# Patient Record
Sex: Female | Born: 2000 | Race: White | Hispanic: No | Marital: Single | Smoking: Never smoker
Health system: Southern US, Community
[De-identification: ages and names within clinical notes are randomized; demographics above are authoritative.]

## PROBLEM LIST (undated history)

## (undated) DIAGNOSIS — N946 Dysmenorrhea, unspecified: Secondary | ICD-10-CM

## (undated) DIAGNOSIS — K589 Irritable bowel syndrome without diarrhea: Secondary | ICD-10-CM

## (undated) DIAGNOSIS — K5010 Crohn's disease of large intestine without complications: Secondary | ICD-10-CM

## (undated) DIAGNOSIS — E079 Disorder of thyroid, unspecified: Secondary | ICD-10-CM

## (undated) DIAGNOSIS — D509 Iron deficiency anemia, unspecified: Secondary | ICD-10-CM

## (undated) DIAGNOSIS — R197 Diarrhea, unspecified: Secondary | ICD-10-CM

## (undated) HISTORY — DX: Diarrhea, unspecified: R19.7

## (undated) HISTORY — PX: CHOLECYSTECTOMY: SHX55

## (undated) HISTORY — PX: WISDOM TOOTH EXTRACTION: SHX21

## (undated) HISTORY — DX: Irritable bowel syndrome, unspecified: K58.9

## (undated) HISTORY — PX: TONSILLECTOMY: SUR1361

---
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
## 2020-08-19 ENCOUNTER — Telehealth: Payer: Self-pay | Admitting: Gastroenterology

## 2020-08-19 NOTE — Telephone Encounter (Signed)
Hey Dr Lavon Paganini, this patient is being referred to [place] by Dr Mila Palmer for Crohn's Disease but they have seen [facility] Feb 2021.  I will send records to you for review. Please advise on scheduling, thank you.

## 2020-08-23 NOTE — Telephone Encounter (Signed)
Dr Lavon Paganini declined referral transfer at this time. Call patient to advise

## 2020-08-24 NOTE — Telephone Encounter (Signed)
Spoke with pt and informed her of Dr Elana Alm message, pt voiced understanding

## 2020-08-30 ENCOUNTER — Other Ambulatory Visit: Payer: Self-pay

## 2020-08-30 ENCOUNTER — Emergency Department (HOSPITAL_COMMUNITY): Payer: Medicaid Other

## 2020-08-30 ENCOUNTER — Encounter (HOSPITAL_COMMUNITY): Payer: Self-pay

## 2020-08-30 ENCOUNTER — Emergency Department (HOSPITAL_COMMUNITY)
Admission: EM | Admit: 2020-08-30 | Discharge: 2020-08-30 | Disposition: A | Discharge status: Home / Self Care | Payer: Medicaid Other | Attending: Emergency Medicine | Admitting: Emergency Medicine

## 2020-08-30 DIAGNOSIS — R1012 Left upper quadrant pain: Secondary | ICD-10-CM | POA: Diagnosis not present

## 2020-08-30 DIAGNOSIS — R1011 Right upper quadrant pain: Secondary | ICD-10-CM | POA: Insufficient documentation

## 2020-08-30 DIAGNOSIS — R000 Tachycardia, unspecified: Secondary | ICD-10-CM | POA: Diagnosis not present

## 2020-08-30 DIAGNOSIS — R1013 Epigastric pain: Secondary | ICD-10-CM

## 2020-08-30 DIAGNOSIS — R1084 Generalized abdominal pain: Secondary | ICD-10-CM | POA: Insufficient documentation

## 2020-08-30 DIAGNOSIS — R109 Unspecified abdominal pain: Secondary | ICD-10-CM | POA: Diagnosis present

## 2020-08-30 LAB — URINALYSIS, ROUTINE W REFLEX MICROSCOPIC
Bilirubin Urine: NEGATIVE
Glucose, UA: NEGATIVE mg/dL
Hgb urine dipstick: NEGATIVE
Ketones, ur: NEGATIVE mg/dL
Nitrite: NEGATIVE
Protein, ur: 30 mg/dL — AB
Specific Gravity, Urine: 1.021 (ref 1.005–1.030)
pH: 5 (ref 5.0–8.0)

## 2020-08-30 LAB — CBC
HCT: 38.6 % (ref 36.0–46.0)
Hemoglobin: 11.3 g/dL — ABNORMAL LOW (ref 12.0–15.0)
MCH: 21 pg — ABNORMAL LOW (ref 26.0–34.0)
MCHC: 29.3 g/dL — ABNORMAL LOW (ref 30.0–36.0)
MCV: 71.9 fL — ABNORMAL LOW (ref 80.0–100.0)
Platelets: 356 10*3/uL (ref 150–400)
RBC: 5.37 MIL/uL — ABNORMAL HIGH (ref 3.87–5.11)
RDW: 17.6 % — ABNORMAL HIGH (ref 11.5–15.5)
WBC: 5.8 10*3/uL (ref 4.0–10.5)
nRBC: 0 % (ref 0.0–0.2)

## 2020-08-30 LAB — COMPREHENSIVE METABOLIC PANEL
ALT: 19 U/L (ref 0–44)
AST: 27 U/L (ref 15–41)
Albumin: 4.2 g/dL (ref 3.5–5.0)
Alkaline Phosphatase: 75 U/L (ref 38–126)
Anion gap: 11 (ref 5–15)
BUN: 6 mg/dL (ref 6–20)
CO2: 24 mmol/L (ref 22–32)
Calcium: 9.3 mg/dL (ref 8.9–10.3)
Chloride: 102 mmol/L (ref 98–111)
Creatinine, Ser: 1.04 mg/dL — ABNORMAL HIGH (ref 0.44–1.00)
GFR, Estimated: >60 mL/min (ref >60)
Glucose, Bld: 105 mg/dL — ABNORMAL HIGH (ref 70–99)
Potassium: 3.2 mmol/L — ABNORMAL LOW (ref 3.5–5.1)
Sodium: 137 mmol/L (ref 135–145)
Total Bilirubin: 0.3 mg/dL (ref 0.3–1.2)
Total Protein: 8.3 g/dL — ABNORMAL HIGH (ref 6.5–8.1)

## 2020-08-30 LAB — I-STAT BETA HCG BLOOD, ED (MC, WL, AP ONLY): I-stat hCG, quantitative: <5 m[IU]/mL (ref <5)

## 2020-08-30 LAB — LIPASE, BLOOD: Lipase: 54 U/L — ABNORMAL HIGH (ref 11–51)

## 2020-08-30 MED ORDER — ONDANSETRON HCL 4 MG/2ML IJ SOLN
4.0000 mg | Freq: Once | INTRAMUSCULAR | Status: AC
  Administered 2020-08-30: 4 mg via INTRAVENOUS
  Filled 2020-08-30: qty 2

## 2020-08-30 MED ORDER — SODIUM CHLORIDE 0.9 % IV BOLUS
1000.0000 mL | Freq: Once | INTRAVENOUS | Status: AC
  Administered 2020-08-30: 1000 mL via INTRAVENOUS

## 2020-08-30 MED ORDER — IOHEXOL 300 MG/ML  SOLN
100.0000 mL | Freq: Once | INTRAMUSCULAR | Status: AC | PRN
  Administered 2020-08-30: 100 mL via INTRAVENOUS

## 2020-08-30 MED ORDER — MORPHINE SULFATE (PF) 2 MG/ML IV SOLN
2.0000 mg | Freq: Once | INTRAVENOUS | Status: AC
  Administered 2020-08-30: 2 mg via INTRAVENOUS
  Filled 2020-08-30: qty 1

## 2020-08-30 MED ORDER — SUCRALFATE 1 G PO TABS: 1.0000 g | ORAL_TABLET | Freq: Three times a day (TID) | ORAL | 1 refills | Status: DC

## 2020-08-30 MED ORDER — PROMETHAZINE HCL 25 MG RE SUPP: 25.0000 mg | Freq: Four times a day (QID) | RECTAL | 1 refills | Status: DC | PRN

## 2020-08-30 NOTE — ED Triage Notes (Signed)
Pt presents with c/o abdominal pain. Pt reports she does have Crohn's disease but reports that for the past month, this pain has been worse than her normal pain. Pt also reports some low grade fevers with the pain and vomiting.

## 2020-08-30 NOTE — Discharge Instructions (Signed)
Please read instructions below. Drink clear liquids until your stomach feels better. Then, slowly introduce bland foods into your diet as tolerated.  Avoid spicy, greasy, acidic foods as this can worsen your symptoms.  Avoid NSAID medications such as Advil/ibuprofen/Motrin, Aleve, aspirin, Goody's powder, BC powder.  Remain is sitting upright for at least 45 minutes after meals. Take the carafate before meals and at bedtime to help with stomach upset. Continue taking your pantoprazole as directed. Continue taking the Pepcid twice daily. Follow up with your gastroenterologist. Return to the ER for severely worsening abdominal pain, fever, uncontrollable vomiting, or vomiting blood.

## 2020-08-30 NOTE — ED Provider Notes (Signed)
[facility]-EMERGENCY DEPT Provider Note   CSN: [id number] Arrival date & time: 08/30/20  1601     History Chief Complaint  Patient presents with  . Abdominal Pain    Alexis Petty is a 19 y.o. female w PMHx crohn dz, iron deficiency anemia, PUD, presenting to the ED with complaint of worsening abdominal pain over the last month. Pain is constant, waxing and waning, dull, worsening last week. Pain is in the epigastrium.  She states pain is preventing her from tolerating much p.o. because whenever she eats she has severe pain.  She is also having some nausea, with intermittent episodes of emesis which she attributes to pain.  Symptoms feel different from prior Crohn's flare or PUD.  Denies assoc fever/chills, urinary sx, new changes in bowel movements.   Last colonoscopy was July 2020.  Followed by [facility] pediatric gastroenterology.  The history is provided by the patient.       Past Medical History:  Diagnosis Date  . Abdominal pain   . Crohn's colitis (HCC)   . Diarrhea   . Dysmenorrhea    Seen at [facility]  . IBS (irritable bowel syndrome)   . IBS (irritable bowel syndrome)   . Iron deficiency anemia     Patient Active Problem List   Diagnosis Date Noted  . Abnormal uterine bleeding (AUB) 12/29/2014  . Anemia due to blood loss, acute 10/03/2013  . Abnormal uterine bleeding 07/07/2013  . Adiposity 10/17/2012  . Abdominal pain   . Diarrhea   . IBS (irritable bowel syndrome)     Past Surgical History:  Procedure Laterality Date  . CHOLECYSTECTOMY    . TONSILLECTOMY Bilateral   . WISDOM TOOTH EXTRACTION       OB History    Gravida  0   Para      Term      Preterm      AB      Living        SAB      TAB      Ectopic      Multiple      Live Births              Family History  Problem Relation Age of Onset  . Heart disease Mother   . Diabetes Father   . Hypertension Father   . Diabetes Maternal Grandmother    . Cancer Paternal Grandmother   . Cancer Paternal Grandfather     Social History   Tobacco Use  . Smoking status: Never Smoker  . Smokeless tobacco: Never Used  Vaping Use  . Vaping Use: Never used  Substance Use Topics  . Alcohol use: No  . Drug use: No    Home Medications Prior to Admission medications   Medication Sig Start Date End Date Taking? Authorizing Provider  ALAYCEN 1/35 tablet Take 1 tablet by mouth daily. 08/19/20  Yes [provider]  balsalazide (COLAZAL) 750 MG capsule Take 2,250 mg by mouth 2 (two) times daily.  02/26/17  Yes [provider]  Cholecalciferol 25 MCG (1000 UT) capsule Take 1,000 Units by mouth 2 (two) times daily. 07/07/19  Yes [provider]  dicyclomine (BENTYL) 20 MG tablet Take 20 mg by mouth as needed for spasms (pain).  11/30/19  Yes [provider]  famotidine (PEPCID) 20 MG tablet Take 20 mg by mouth as needed for heartburn or indigestion (take when protonix isn't helping).   Yes [provider]  ondansetron (ZOFRAN-ODT) 8 MG disintegrating tablet Take 8 mg by mouth every 8 (eight) hours as needed for nausea or vomiting.  10/07/15  Yes [provider]  pantoprazole (PROTONIX) 40 MG tablet Take 40 mg by mouth 2 (two) times daily. 08/24/20  Yes [provider]  albuterol (PROVENTIL HFA;VENTOLIN HFA) 108 (90 Base) MCG/ACT inhaler Inhale 2 puffs into the lungs every 4 (four) hours as needed for wheezing or shortness of breath. Patient not taking: Reported on 04/27/2017 01/10/16   Mesner, Barbara Cower, MD  promethazine (PHENERGAN) 25 MG suppository Place 1 suppository (25 mg total) rectally every 6 (six) hours as needed for nausea or vomiting. 08/30/20   Netty Sullivant, [place] N, PA-C  sucralfate (CARAFATE) 1 g tablet Take 1 tablet (1 g total) by mouth 4 (four) times daily -  with meals and at bedtime. 08/30/20   Denene Alamillo, [place] N, PA-C    Allergies    Ibuprofen, Reglan [metoclopramide], Tylenol  [acetaminophen], and Haldol [haloperidol]  Review of Systems   Review of Systems  Gastrointestinal: Positive for abdominal pain and nausea.  All other systems reviewed and are negative.   Physical Exam Updated Vital Signs BP (!) 145/72   Pulse 99   Temp 98.5 F (36.9 C) (Oral)   Resp 17   LMP 08/16/2020 (Approximate)   SpO2 100%   Physical Exam Vitals and nursing note reviewed.  Constitutional:      General: She is not in acute distress.    Appearance: She is well-developed. She is not ill-appearing.  HENT:     Head: Normocephalic and atraumatic.     Mouth/Throat:     Mouth: Mucous membranes are moist.  Eyes:     Conjunctiva/sclera: Conjunctivae normal.  Cardiovascular:     Rate and Rhythm: Regular rhythm. Tachycardia present.  Pulmonary:     Effort: Pulmonary effort is normal. No respiratory distress.     Breath sounds: Normal breath sounds.  Abdominal:     Palpations: Abdomen is soft.     Tenderness: There is abdominal tenderness in the right upper quadrant, epigastric area and left upper quadrant. There is no guarding or rebound.  Skin:    General: Skin is warm.  Neurological:     Mental Status: She is alert.  Psychiatric:        Behavior: Behavior normal.     ED Results / Procedures / Treatments   Labs (all labs ordered are listed, but only abnormal results are displayed) Labs Reviewed  LIPASE, BLOOD - Abnormal; Notable for the following components:      Result Value   Lipase 54 (*)    All other components within normal limits  COMPREHENSIVE METABOLIC PANEL - Abnormal; Notable for the following components:   Potassium 3.2 (*)    Glucose, Bld 105 (*)    Creatinine, Ser 1.04 (*)    Total Protein 8.3 (*)    All other components within normal limits  CBC - Abnormal; Notable for the following components:   RBC 5.37 (*)    Hemoglobin 11.3 (*)    MCV 71.9 (*)    MCH 21.0 (*)    MCHC 29.3 (*)    RDW 17.6 (*)    All other components within normal limits   URINALYSIS, ROUTINE W REFLEX MICROSCOPIC - Abnormal; Notable for the following components:   Color, Urine AMBER (*)    APPearance HAZY (*)    Protein, ur 30 (*)    Leukocytes,Ua SMALL (*)    Bacteria, UA MANY (*)  All other components within normal limits  I-STAT BETA HCG BLOOD, ED ([facility], [facility], [facility] ONLY)    EKG None  Radiology CT Abdomen Pelvis W Contrast  Result Date: 08/30/2020 CLINICAL DATA:  Abdominal pain. EXAM: CT ABDOMEN AND PELVIS WITH CONTRAST TECHNIQUE: Multidetector CT imaging of the abdomen and pelvis was performed using the standard protocol following bolus administration of intravenous contrast. CONTRAST:  OMNIPAQUE IOHEXOL 300 MG/ML  SOLN COMPARISON:  August 31, 2019 FINDINGS: Lower chest: No acute abnormality. Hepatobiliary: No focal liver abnormality is seen. Status post cholecystectomy. No biliary dilatation. Pancreas: Unremarkable. No pancreatic ductal dilatation or surrounding inflammatory changes. Spleen: Normal in size without focal abnormality. Adrenals/Urinary Tract: Adrenal glands are unremarkable. Kidneys are normal, without renal calculi, focal lesion, or hydronephrosis. Bladder is unremarkable. Stomach/Bowel: Stomach is within normal limits. Appendix appears normal. No evidence of bowel wall thickening, distention, or inflammatory changes. Vascular/Lymphatic: No significant vascular findings are present. No enlarged abdominal or pelvic lymph nodes are identified. Multiple stable subcentimeter mesenteric lymph nodes are seen scattered throughout the abdomen. Reproductive: Uterus and bilateral adnexa are unremarkable. Other: No abdominal wall hernia or abnormality. No abdominopelvic ascites. Musculoskeletal: No acute or significant osseous findings. IMPRESSION: 1. Evidence of prior cholecystectomy. 2. No evidence of an acute or active process within the abdomen or pelvis. Electronically Signed   By: Aram Candela M.D.   On: 08/30/2020 19:24     Procedures Procedures (including critical care time)  Medications Ordered in ED Medications  ondansetron (ZOFRAN) injection 4 mg (4 mg Intravenous Given 08/30/20 1701)  sodium chloride 0.9 % bolus 1,000 mL (0 mLs Intravenous Stopped 08/30/20 1948)  morphine 2 MG/ML injection 2 mg (2 mg Intravenous Given 08/30/20 1702)  iohexol (OMNIPAQUE) 300 MG/ML solution 100 mL (100 mLs Intravenous Contrast Given 08/30/20 1840)  sodium chloride 0.9 % bolus 1,000 mL (0 mLs Intravenous Stopped 08/30/20 2207)  ondansetron (ZOFRAN) injection 4 mg (4 mg Intravenous Given 08/30/20 2016)    ED Course  I have reviewed the triage vital signs and the nursing notes.  Pertinent labs & imaging results that were available during my care of the patient were reviewed by me and considered in my medical decision making (see chart for details).    MDM Rules/Calculators/A&P                          Patient with history of Crohn's disease, PUD,  presenting with gradually worsening epigastric pain over the last month.  She states pain is preventing her from tolerating much p.o. because whenever she eats she has severe pain.  She is also having some nausea, with intermittent episodes of emesis which she attributes to pain.  On exam, she is noted to be tachycardic though in no acute distress.  Abdomen is soft with tenderness in the upper to mid.  No peritoneal signs.  Laboratory work-up ordered, IV fluids, pain medication and antiemetics.  Considering patient's abdominal history, CT scan is also obtained for further evaluation.  Abs are very reassuring, no leukocytosis.  Metabolic panel without significant change from baseline.  UA appears to have some contaminant however no bacteria.  Patient is asymptomatic.  Antibiotics not indicated.  CT scan is negative.  On reevaluation, patient reports improvement in symptoms.  He is tolerating p.o. fluids and feels comfortable with discharge.  Suspect patient's symptoms are likely  due to gastritis versus peptic ulcer.  She is instructed to continue PPI and H2 blockers.  We  will also prescribe Carafate, rectal Phenergan for nausea relief.  She is followed by [facility] pediatric gastroenterology, recommend she follow closely.  Return for worsening symptoms.  Patient and her mother verbalized understanding agree with care plan.  Discussed results, findings, treatment and follow up. Patient advised of return precautions. Patient verbalized understanding and agreed with plan.  Final Clinical Impression(s) / ED Diagnoses Final diagnoses:  Epigastric abdominal pain    Rx / DC Orders ED Discharge Orders         Ordered    promethazine (PHENERGAN) 25 MG suppository  Every 6 hours PRN        08/30/20 2135    sucralfate (CARAFATE) 1 g tablet  3 times daily with meals & bedtime        08/30/20 2135           Joey Hudock, [place] N, PA-C 08/30/20 2229    Sabas Sous, MD 08/30/20 2257

## 2020-08-30 NOTE — ED Notes (Signed)
Pt given water and ginger ale for fluid challenge. Pt tolerated fluids well

## 2020-10-13 ENCOUNTER — Other Ambulatory Visit: Payer: Self-pay

## 2020-10-13 ENCOUNTER — Emergency Department (HOSPITAL_COMMUNITY)
Admission: EM | Admit: 2020-10-13 | Discharge: 2020-10-13 | Disposition: A | Discharge status: Home / Self Care | Payer: Medicaid Other | Attending: Emergency Medicine | Admitting: Emergency Medicine

## 2020-10-13 DIAGNOSIS — R112 Nausea with vomiting, unspecified: Secondary | ICD-10-CM | POA: Insufficient documentation

## 2020-10-13 DIAGNOSIS — Z5321 Procedure and treatment not carried out due to patient leaving prior to being seen by health care provider: Secondary | ICD-10-CM | POA: Insufficient documentation

## 2020-10-13 DIAGNOSIS — R1030 Lower abdominal pain, unspecified: Secondary | ICD-10-CM | POA: Diagnosis present

## 2020-10-13 DIAGNOSIS — K5900 Constipation, unspecified: Secondary | ICD-10-CM | POA: Diagnosis not present

## 2020-10-13 LAB — CBC
HCT: 36.4 % (ref 36.0–46.0)
Hemoglobin: 10.5 g/dL — ABNORMAL LOW (ref 12.0–15.0)
MCH: 21.5 pg — ABNORMAL LOW (ref 26.0–34.0)
MCHC: 28.8 g/dL — ABNORMAL LOW (ref 30.0–36.0)
MCV: 74.6 fL — ABNORMAL LOW (ref 80.0–100.0)
Platelets: 302 10*3/uL (ref 150–400)
RBC: 4.88 MIL/uL (ref 3.87–5.11)
RDW: 20.1 % — ABNORMAL HIGH (ref 11.5–15.5)
WBC: 6.9 10*3/uL (ref 4.0–10.5)
nRBC: 0 % (ref 0.0–0.2)

## 2020-10-13 LAB — LIPASE, BLOOD: Lipase: 87 U/L — ABNORMAL HIGH (ref 11–51)

## 2020-10-13 LAB — I-STAT BETA HCG BLOOD, ED (MC, WL, AP ONLY): I-stat hCG, quantitative: <5 m[IU]/mL (ref <5)

## 2020-10-13 LAB — COMPREHENSIVE METABOLIC PANEL
ALT: 18 U/L (ref 0–44)
AST: 22 U/L (ref 15–41)
Albumin: 3.8 g/dL (ref 3.5–5.0)
Alkaline Phosphatase: 62 U/L (ref 38–126)
Anion gap: 17 — ABNORMAL HIGH (ref 5–15)
BUN: 11 mg/dL (ref 6–20)
CO2: 19 mmol/L — ABNORMAL LOW (ref 22–32)
Calcium: 9 mg/dL (ref 8.9–10.3)
Chloride: 102 mmol/L (ref 98–111)
Creatinine, Ser: 0.88 mg/dL (ref 0.44–1.00)
GFR, Estimated: >60 mL/min (ref >60)
Glucose, Bld: 93 mg/dL (ref 70–99)
Potassium: 3.8 mmol/L (ref 3.5–5.1)
Sodium: 138 mmol/L (ref 135–145)
Total Bilirubin: 0.9 mg/dL (ref 0.3–1.2)
Total Protein: 7.5 g/dL (ref 6.5–8.1)

## 2020-10-13 NOTE — ED Triage Notes (Signed)
Pt BIBA from home-  Per EMS- Pt reports lower abdominal pain and constipation x16 days. C/o n/v for same.  Pt reports trying oral zofran but unable to keep it down. Pt reports poor PO intake, recent weight loss.    Hx of Crohns.   20g L AC- PIV 850 NaCL 4 mg zofran,  250 mcg fentanyl  Sinus tach at 150 prior to fluid administration, 115 after fluids. Pt reports difficulty with ambulation.   Pt reports ongoing emesis, bile and stomach contents.   AOx4.

## 2020-10-13 NOTE — ED Notes (Signed)
Pt requested IV removal so she could leave prior to being seen by provider. Pt encouraged to stay.

## 2020-10-30 ENCOUNTER — Emergency Department (HOSPITAL_COMMUNITY)
Admission: EM | Admit: 2020-10-30 | Discharge: 2020-10-30 | Disposition: A | Discharge status: Home / Self Care | Payer: Medicaid Other | Attending: Emergency Medicine | Admitting: Emergency Medicine

## 2020-10-30 ENCOUNTER — Emergency Department (HOSPITAL_COMMUNITY): Payer: Medicaid Other

## 2020-10-30 ENCOUNTER — Other Ambulatory Visit: Payer: Self-pay

## 2020-10-30 ENCOUNTER — Encounter (HOSPITAL_COMMUNITY): Payer: Self-pay | Admitting: Obstetrics and Gynecology

## 2020-10-30 DIAGNOSIS — Z9049 Acquired absence of other specified parts of digestive tract: Secondary | ICD-10-CM | POA: Insufficient documentation

## 2020-10-30 DIAGNOSIS — Z20822 Contact with and (suspected) exposure to covid-19: Secondary | ICD-10-CM | POA: Diagnosis not present

## 2020-10-30 DIAGNOSIS — R1011 Right upper quadrant pain: Secondary | ICD-10-CM | POA: Insufficient documentation

## 2020-10-30 DIAGNOSIS — E876 Hypokalemia: Secondary | ICD-10-CM

## 2020-10-30 DIAGNOSIS — Z8719 Personal history of other diseases of the digestive system: Secondary | ICD-10-CM | POA: Insufficient documentation

## 2020-10-30 DIAGNOSIS — R1013 Epigastric pain: Secondary | ICD-10-CM | POA: Insufficient documentation

## 2020-10-30 DIAGNOSIS — R109 Unspecified abdominal pain: Secondary | ICD-10-CM

## 2020-10-30 LAB — COMPREHENSIVE METABOLIC PANEL
ALT: 150 U/L — ABNORMAL HIGH (ref 0–44)
AST: 64 U/L — ABNORMAL HIGH (ref 15–41)
Albumin: 4.3 g/dL (ref 3.5–5.0)
Alkaline Phosphatase: 95 U/L (ref 38–126)
Anion gap: 16 — ABNORMAL HIGH (ref 5–15)
BUN: 10 mg/dL (ref 6–20)
CO2: 23 mmol/L (ref 22–32)
Calcium: 9.8 mg/dL (ref 8.9–10.3)
Chloride: 101 mmol/L (ref 98–111)
Creatinine, Ser: 0.91 mg/dL (ref 0.44–1.00)
GFR, Estimated: >60 mL/min (ref >60)
Glucose, Bld: 106 mg/dL — ABNORMAL HIGH (ref 70–99)
Potassium: 3.2 mmol/L — ABNORMAL LOW (ref 3.5–5.1)
Sodium: 140 mmol/L (ref 135–145)
Total Bilirubin: 1.3 mg/dL — ABNORMAL HIGH (ref 0.3–1.2)
Total Protein: 8.5 g/dL — ABNORMAL HIGH (ref 6.5–8.1)

## 2020-10-30 LAB — LIPASE, BLOOD: Lipase: 101 U/L — ABNORMAL HIGH (ref 11–51)

## 2020-10-30 LAB — CBC
HCT: 39.9 % (ref 36.0–46.0)
Hemoglobin: 12.2 g/dL (ref 12.0–15.0)
MCH: 22.1 pg — ABNORMAL LOW (ref 26.0–34.0)
MCHC: 30.6 g/dL (ref 30.0–36.0)
MCV: 72.4 fL — ABNORMAL LOW (ref 80.0–100.0)
Platelets: 402 10*3/uL — ABNORMAL HIGH (ref 150–400)
RBC: 5.51 MIL/uL — ABNORMAL HIGH (ref 3.87–5.11)
RDW: 20.7 % — ABNORMAL HIGH (ref 11.5–15.5)
WBC: 7.9 10*3/uL (ref 4.0–10.5)
nRBC: 0 % (ref 0.0–0.2)

## 2020-10-30 LAB — URINALYSIS, ROUTINE W REFLEX MICROSCOPIC
Bilirubin Urine: NEGATIVE
Glucose, UA: NEGATIVE mg/dL
Ketones, ur: 80 mg/dL — AB
Leukocytes,Ua: NEGATIVE
Nitrite: NEGATIVE
Protein, ur: 30 mg/dL — AB
RBC / HPF: >50 RBC/hpf — ABNORMAL HIGH (ref 0–5)
Specific Gravity, Urine: <1.005 — ABNORMAL LOW (ref 1.005–1.030)
pH: 6 (ref 5.0–8.0)

## 2020-10-30 LAB — I-STAT BETA HCG BLOOD, ED (MC, WL, AP ONLY): I-stat hCG, quantitative: <5 m[IU]/mL (ref <5)

## 2020-10-30 MED ORDER — SODIUM CHLORIDE 0.9 % IV SOLN: INTRAVENOUS | Status: DC

## 2020-10-30 MED ORDER — ONDANSETRON HCL 4 MG/2ML IJ SOLN
4.0000 mg | Freq: Once | INTRAMUSCULAR | Status: AC
  Administered 2020-10-30: 4 mg via INTRAVENOUS
  Filled 2020-10-30: qty 2

## 2020-10-30 MED ORDER — IOHEXOL 300 MG/ML  SOLN
100.0000 mL | Freq: Once | INTRAMUSCULAR | Status: AC | PRN
  Administered 2020-10-30: 100 mL via INTRAVENOUS

## 2020-10-30 MED ORDER — MORPHINE SULFATE (PF) 4 MG/ML IV SOLN
6.0000 mg | Freq: Once | INTRAVENOUS | Status: AC
  Administered 2020-10-30: 6 mg via INTRAVENOUS
  Filled 2020-10-30: qty 2

## 2020-10-30 MED ORDER — PROMETHAZINE HCL 25 MG RE SUPP: 25.0000 mg | Freq: Four times a day (QID) | RECTAL | 0 refills | Status: DC | PRN

## 2020-10-30 MED ORDER — SODIUM CHLORIDE 0.9 % IV BOLUS
1000.0000 mL | Freq: Once | INTRAVENOUS | Status: AC
  Administered 2020-10-30: 1000 mL via INTRAVENOUS

## 2020-10-30 MED ORDER — POTASSIUM CHLORIDE CRYS ER 20 MEQ PO TBCR
40.0000 meq | EXTENDED_RELEASE_TABLET | Freq: Once | ORAL | Status: AC
  Administered 2020-10-30: 40 meq via ORAL
  Filled 2020-10-30: qty 2

## 2020-10-30 MED ORDER — HYDROMORPHONE HCL 1 MG/ML IJ SOLN: 1.0000 mg | Freq: Once | INTRAMUSCULAR | Status: DC

## 2020-10-30 NOTE — ED Notes (Signed)
Pt wanting to wait till her Mom was able to come back to her room before getting urine sample.

## 2020-10-30 NOTE — ED Triage Notes (Signed)
Patient reports to the ER BIB EMS for abdominal pain. Paitnet reports she has Chrons. Patient reports N/V and that she has not been able to "eat or drink for 4 weeks" Patient's been given 4mg  of zofran with no relief.  Patient reports she is tender to her abdomen.  Patinet has a 22g IV in the L wrist.

## 2020-10-30 NOTE — ED Provider Notes (Signed)
[facility]-EMERGENCY DEPT Provider Note   CSN: [id number] Arrival date & time: 10/30/20  1326     History Chief Complaint  Patient presents with  . Abdominal Pain    Ronasia Isola is a 20 y.o. female.  20 year old female with history of Crohn's disease who presents with epigastric abdominal pain similar to her Crohn's disease.  States that she has had a longstanding history of emesis and scheduled to have an EGD and colonoscopy done next week.  No fever or chills.  Emesis has been nonbilious or bloody.  Denies any urinary symptoms.  Pain is crampy and unresponsive to home medications.        Past Medical History:  Diagnosis Date  . Abdominal pain   . Crohn's colitis (HCC)   . Diarrhea   . Dysmenorrhea    Seen at [facility]  . IBS (irritable bowel syndrome)   . IBS (irritable bowel syndrome)   . Iron deficiency anemia     Patient Active Problem List   Diagnosis Date Noted  . Abnormal uterine bleeding (AUB) 12/29/2014  . Anemia due to blood loss, acute 10/03/2013  . Abnormal uterine bleeding 07/07/2013  . Adiposity 10/17/2012  . Abdominal pain   . Diarrhea   . IBS (irritable bowel syndrome)     Past Surgical History:  Procedure Laterality Date  . CHOLECYSTECTOMY    . TONSILLECTOMY Bilateral   . WISDOM TOOTH EXTRACTION       OB History    Gravida  0   Para      Term      Preterm      AB      Living        SAB      IAB      Ectopic      Multiple      Live Births              Family History  Problem Relation Age of Onset  . Heart disease Mother   . Diabetes Father   . Hypertension Father   . Diabetes Maternal Grandmother   . Cancer Paternal Grandmother   . Cancer Paternal Grandfather     Social History   Tobacco Use  . Smoking status: Never Smoker  . Smokeless tobacco: Never Used  Vaping Use  . Vaping Use: Never used  Substance Use Topics  . Alcohol use: No  . Drug use: No    Home  Medications Prior to Admission medications   Medication Sig Start Date End Date Taking? Authorizing Provider  ALAYCEN 1/35 tablet Take 1 tablet by mouth daily. 08/19/20  Yes [provider]  balsalazide (COLAZAL) 750 MG capsule Take 2,250 mg by mouth 2 (two) times daily.  02/26/17  Yes [provider]  Cholecalciferol 25 MCG (1000 UT) capsule Take 1,000 Units by mouth 2 (two) times daily. 07/07/19  Yes [provider]  dicyclomine (BENTYL) 20 MG tablet Take 20 mg by mouth daily as needed for spasms (pain). 11/30/19  Yes [provider]  famotidine (PEPCID) 20 MG tablet Take 20 mg by mouth daily as needed for heartburn or indigestion (take when protonix isn't helping).   Yes [provider]  ferrous sulfate 325 (65 FE) MG EC tablet Take 325 mg by mouth daily with breakfast. 10/18/20 12/17/20 Yes [provider]  ondansetron (ZOFRAN-ODT) 8 MG disintegrating tablet Take 8 mg by mouth every 8 (eight) hours as needed for nausea or vomiting.  10/07/15  Yes [provider]  pantoprazole (PROTONIX) 40 MG tablet Take 40 mg by mouth 2 (two) times daily. 08/24/20  Yes [provider]  promethazine (PHENERGAN) 25 MG suppository Place 1 suppository (25 mg total) rectally every 6 (six) hours as needed for nausea or vomiting. 08/30/20  Yes Robinson, [place] N, PA-C  sucralfate (CARAFATE) 1 g tablet Take 1 tablet (1 g total) by mouth 4 (four) times daily -  with meals and at bedtime. 08/30/20  Yes Robinson, [place] N, PA-C  zinc gluconate 50 MG tablet Take 50 mg by mouth daily. 10/18/20 01/16/21 Yes [provider]  albuterol (PROVENTIL HFA;VENTOLIN HFA) 108 (90 Base) MCG/ACT inhaler Inhale 2 puffs into the lungs every 4 (four) hours as needed for wheezing or shortness of breath. Patient not taking: No sig reported 01/10/16   Mesner, Barbara Cower, MD    Allergies    Coconut (cocos nucifera) allergy skin test, Ibuprofen, Reglan [metoclopramide],  Tylenol [acetaminophen], and Haldol [haloperidol]  Review of Systems   Review of Systems  All other systems reviewed and are negative.   Physical Exam Updated Vital Signs BP 138/86 (BP Location: Right Arm)   Pulse (!) 115   Temp 99.3 F (37.4 C) (Oral)   Resp 18   Ht 1.6 m (5\' 3" )   Wt 79 kg   LMP 10/10/2020 (Exact Date)   SpO2 100%   BMI 30.85 kg/m   Physical Exam Vitals and nursing note reviewed.  Constitutional:      General: She is not in acute distress.    Appearance: Normal appearance. She is well-developed and well-nourished. She is not toxic-appearing.  HENT:     Head: Normocephalic and atraumatic.  Eyes:     General: Lids are normal.     Extraocular Movements: EOM normal.     Conjunctiva/sclera: Conjunctivae normal.     Pupils: Pupils are equal, round, and reactive to light.  Neck:     Thyroid: No thyroid mass.     Trachea: No tracheal deviation.  Cardiovascular:     Rate and Rhythm: Normal rate and regular rhythm.     Heart sounds: Normal heart sounds. No murmur heard. No gallop.   Pulmonary:     Effort: Pulmonary effort is normal. No respiratory distress.     Breath sounds: Normal breath sounds. No stridor. No decreased breath sounds, wheezing, rhonchi or rales.  Abdominal:     General: Bowel sounds are normal. There is no distension.     Palpations: Abdomen is soft.     Tenderness: There is abdominal tenderness in the right upper quadrant and epigastric area. There is no CVA tenderness or rebound.    Musculoskeletal:        General: No tenderness or edema. Normal range of motion.     Cervical back: Normal range of motion and neck supple.  Skin:    General: Skin is warm and dry.     Findings: No abrasion or rash.  Neurological:     Mental Status: She is alert and oriented to person, place, and time.     GCS: GCS eye subscore is 4. GCS verbal subscore is 5. GCS motor subscore is 6.     Cranial Nerves: No cranial nerve deficit.     Sensory: No  sensory deficit.     Deep Tendon Reflexes: Strength normal.  Psychiatric:        Mood and Affect: Mood and affect normal.        Speech: Speech normal.  Behavior: Behavior normal.     ED Results / Procedures / Treatments   Labs (all labs ordered are listed, but only abnormal results are displayed) Labs Reviewed  LIPASE, BLOOD - Abnormal; Notable for the following components:      Result Value   Lipase 101 (*)    All other components within normal limits  COMPREHENSIVE METABOLIC PANEL - Abnormal; Notable for the following components:   Potassium 3.2 (*)    Glucose, Bld 106 (*)    Total Protein 8.5 (*)    AST 64 (*)    ALT 150 (*)    Total Bilirubin 1.3 (*)    Anion gap 16 (*)    All other components within normal limits  CBC - Abnormal; Notable for the following components:   RBC 5.51 (*)    MCV 72.4 (*)    MCH 22.1 (*)    RDW 20.7 (*)    Platelets 402 (*)    All other components within normal limits  SARS CORONAVIRUS 2 (TAT 6-24 HRS)  URINALYSIS, ROUTINE W REFLEX MICROSCOPIC  I-STAT BETA HCG BLOOD, ED ([facility], [facility], [facility] ONLY)    EKG None  Radiology No results found.  Procedures Procedures (including critical care time)  Medications Ordered in ED Medications  sodium chloride 0.9 % bolus 1,000 mL (has no administration in time range)  0.9 %  sodium chloride infusion (has no administration in time range)  ondansetron (ZOFRAN) injection 4 mg (has no administration in time range)  morphine 4 MG/ML injection 6 mg (has no administration in time range)    ED Course  I have reviewed the triage vital signs and the nursing notes.  Pertinent labs & imaging results that were available during my care of the patient were reviewed by me and considered in my medical decision making (see chart for details).    MDM Rules/Calculators/A&P                          Patient given IV fluids here as well as antiemetics.  Feels better.  Abdominal CT without acute findings.  Mild  hypokalemia noted and treated with oral potassium.  Will refill patient's Phenergan suppository Final Clinical Impression(s) / ED Diagnoses Final diagnoses:  None    Rx / DC Orders ED Discharge Orders    None       Lorre Nick, MD 10/30/20 [phone]

## 2020-10-31 LAB — SARS CORONAVIRUS 2 (TAT 6-24 HRS): SARS Coronavirus 2: NEGATIVE

## 2021-04-22 ENCOUNTER — Emergency Department (HOSPITAL_COMMUNITY)
Admission: EM | Admit: 2021-04-22 | Discharge: 2021-04-23 | Disposition: A | Discharge status: Home / Self Care | Payer: Medicaid Other | Attending: Emergency Medicine | Admitting: Emergency Medicine

## 2021-04-22 ENCOUNTER — Other Ambulatory Visit: Payer: Self-pay

## 2021-04-22 ENCOUNTER — Encounter (HOSPITAL_COMMUNITY): Payer: Self-pay

## 2021-04-22 DIAGNOSIS — R112 Nausea with vomiting, unspecified: Secondary | ICD-10-CM | POA: Insufficient documentation

## 2021-04-22 DIAGNOSIS — N9489 Other specified conditions associated with female genital organs and menstrual cycle: Secondary | ICD-10-CM | POA: Insufficient documentation

## 2021-04-22 DIAGNOSIS — R1013 Epigastric pain: Secondary | ICD-10-CM | POA: Insufficient documentation

## 2021-04-22 LAB — CBC WITH DIFFERENTIAL/PLATELET
Abs Immature Granulocytes: 0.05 10*3/uL (ref 0.00–0.07)
Basophils Absolute: 0 10*3/uL (ref 0.0–0.1)
Basophils Relative: 0 %
Eosinophils Absolute: 0 10*3/uL (ref 0.0–0.5)
Eosinophils Relative: 0 %
HCT: 34.5 % — ABNORMAL LOW (ref 36.0–46.0)
Hemoglobin: 10.4 g/dL — ABNORMAL LOW (ref 12.0–15.0)
Immature Granulocytes: 0 %
Lymphocytes Relative: 7 %
Lymphs Abs: 1 10*3/uL (ref 0.7–4.0)
MCH: 21.9 pg — ABNORMAL LOW (ref 26.0–34.0)
MCHC: 30.1 g/dL (ref 30.0–36.0)
MCV: 72.8 fL — ABNORMAL LOW (ref 80.0–100.0)
Monocytes Absolute: 0.9 10*3/uL (ref 0.1–1.0)
Monocytes Relative: 7 %
Neutro Abs: 11.2 10*3/uL — ABNORMAL HIGH (ref 1.7–7.7)
Neutrophils Relative %: 86 %
Platelets: 429 10*3/uL — ABNORMAL HIGH (ref 150–400)
RBC: 4.74 MIL/uL (ref 3.87–5.11)
RDW: 17.2 % — ABNORMAL HIGH (ref 11.5–15.5)
WBC: 13.2 10*3/uL — ABNORMAL HIGH (ref 4.0–10.5)
nRBC: 0 % (ref 0.0–0.2)

## 2021-04-22 LAB — COMPREHENSIVE METABOLIC PANEL
ALT: 14 U/L (ref 0–44)
AST: 16 U/L (ref 15–41)
Albumin: 3.4 g/dL — ABNORMAL LOW (ref 3.5–5.0)
Alkaline Phosphatase: 59 U/L (ref 38–126)
Anion gap: 8 (ref 5–15)
BUN: 12 mg/dL (ref 6–20)
CO2: 23 mmol/L (ref 22–32)
Calcium: 9 mg/dL (ref 8.9–10.3)
Chloride: 107 mmol/L (ref 98–111)
Creatinine, Ser: 0.99 mg/dL (ref 0.44–1.00)
GFR, Estimated: >60 mL/min (ref >60)
Glucose, Bld: 106 mg/dL — ABNORMAL HIGH (ref 70–99)
Potassium: 4.1 mmol/L (ref 3.5–5.1)
Sodium: 138 mmol/L (ref 135–145)
Total Bilirubin: 0.5 mg/dL (ref 0.3–1.2)
Total Protein: 7.1 g/dL (ref 6.5–8.1)

## 2021-04-22 LAB — LIPASE, BLOOD: Lipase: 31 U/L (ref 11–51)

## 2021-04-22 LAB — HCG, QUANTITATIVE, PREGNANCY: hCG, Beta Chain, Quant, S: <1 m[IU]/mL (ref <5)

## 2021-04-22 MED ORDER — FENTANYL CITRATE (PF) 100 MCG/2ML IJ SOLN
100.0000 ug | INTRAMUSCULAR | Status: AC | PRN
  Administered 2021-04-22 – 2021-04-23 (×3): 100 ug via INTRAVENOUS
  Filled 2021-04-22 (×3): qty 2

## 2021-04-22 MED ORDER — SODIUM CHLORIDE 0.9 % IV BOLUS
1000.0000 mL | Freq: Once | INTRAVENOUS | Status: AC
  Administered 2021-04-22: 1000 mL via INTRAVENOUS

## 2021-04-22 MED ORDER — ONDANSETRON HCL 4 MG/2ML IJ SOLN
4.0000 mg | Freq: Once | INTRAMUSCULAR | Status: AC
Start: 2021-04-22 — End: 2021-04-22
  Administered 2021-04-22: 4 mg via INTRAVENOUS
  Filled 2021-04-22: qty 2

## 2021-04-22 NOTE — ED Triage Notes (Signed)
Pt to ED by EMS from home with c/o NV and burning in chest which began a couple of hours ago. Pt reports this is similar to previous crohns flare ups. Arrives A+O, VSS, NADN.

## 2021-04-22 NOTE — ED Provider Notes (Signed)
[facility]-EMERGENCY DEPT Provider Note   CSN: [id number] Arrival date & time: 04/22/21  2104     History Chief Complaint  Patient presents with   Crohn's Disease    Alexis Petty is a 20 y.o. female.  HPI She complains of vomiting on and off for a year, worse over last 2 days associated with upper abdominal pain, which started today.  She states she has a history of Crohn's disease but was told that it got better so she had to take any more medication.  She has never had surgery for Crohn's disease.  She denies blood in emesis, current constipation, cough, shortness of breath, weakness or dizziness.  She had a normal bowel movement today.  She has had low-grade fever to "101."  No known sick contacts.  There are no other known acute modifying factors.    Past Medical History:  Diagnosis Date   Abdominal pain    Crohn's colitis (HCC)    Diarrhea    Dysmenorrhea    Seen at [facility]   IBS (irritable bowel syndrome)    IBS (irritable bowel syndrome)    Iron deficiency anemia     Patient Active Problem List   Diagnosis Date Noted   Abnormal uterine bleeding (AUB) 12/29/2014   Anemia due to blood loss, acute 10/03/2013   Abnormal uterine bleeding 07/07/2013   Adiposity 10/17/2012   Abdominal pain    Diarrhea    IBS (irritable bowel syndrome)     Past Surgical History:  Procedure Laterality Date   CHOLECYSTECTOMY     TONSILLECTOMY Bilateral    WISDOM TOOTH EXTRACTION       OB History     Gravida  0   Para      Term      Preterm      AB      Living         SAB      IAB      Ectopic      Multiple      Live Births              Family History  Problem Relation Age of Onset   Heart disease Mother    Diabetes Father    Hypertension Father    Diabetes Maternal Grandmother    Cancer Paternal Grandmother    Cancer Paternal Grandfather     Social History   Tobacco Use   Smoking status: Never   Smokeless tobacco:  Never  Vaping Use   Vaping Use: Never used  Substance Use Topics   Alcohol use: No   Drug use: No    Home Medications Prior to Admission medications   Medication Sig Start Date End Date Taking? Authorizing Provider  ALAYCEN 1/35 tablet Take 1 tablet by mouth daily. 08/19/20   [provider]  albuterol (PROVENTIL HFA;VENTOLIN HFA) 108 (90 Base) MCG/ACT inhaler Inhale 2 puffs into the lungs every 4 (four) hours as needed for wheezing or shortness of breath. Patient not taking: No sig reported 01/10/16   Mesner, Barbara Cower, MD  balsalazide (COLAZAL) 750 MG capsule Take 2,250 mg by mouth 2 (two) times daily.  02/26/17   [provider]  Cholecalciferol 25 MCG (1000 UT) capsule Take 1,000 Units by mouth 2 (two) times daily. 07/07/19   [provider]  dicyclomine (BENTYL) 20 MG tablet Take 20 mg by mouth daily as needed for spasms (pain). 11/30/19   [provider]  famotidine (PEPCID) 20  MG tablet Take 20 mg by mouth daily as needed for heartburn or indigestion (take when protonix isn't helping).    [provider]  ondansetron (ZOFRAN-ODT) 8 MG disintegrating tablet Take 8 mg by mouth every 8 (eight) hours as needed for nausea or vomiting.  10/07/15   [provider]  pantoprazole (PROTONIX) 40 MG tablet Take 40 mg by mouth 2 (two) times daily. 08/24/20   [provider]  promethazine (PHENERGAN) 25 MG suppository Place 1 suppository (25 mg total) rectally every 6 (six) hours as needed for nausea or vomiting. 08/30/20   Robinson, [place] N, PA-C  promethazine (PHENERGAN) 25 MG suppository Place 1 suppository (25 mg total) rectally every 6 (six) hours as needed for nausea or vomiting. 10/30/20   Lorre Nick, MD  sucralfate (CARAFATE) 1 g tablet Take 1 tablet (1 g total) by mouth 4 (four) times daily -  with meals and at bedtime. 08/30/20   Robinson, [place] N, PA-C    Allergies    Coconut (cocos nucifera) allergy skin test, Ibuprofen, Reglan  [metoclopramide], Tylenol [acetaminophen], and Haldol [haloperidol]  Review of Systems   Review of Systems  All other systems reviewed and are negative.  Physical Exam Updated Vital Signs BP 115/62 (BP Location: Right Arm)   Pulse 91   Temp 98.5 F (36.9 C) (Oral)   Resp 12   SpO2 100%   Physical Exam Vitals and nursing note reviewed.  Constitutional:      General: She is not in acute distress.    Appearance: She is well-developed. She is not ill-appearing, toxic-appearing or diaphoretic.  HENT:     Head: Normocephalic and atraumatic.  Eyes:     Conjunctiva/sclera: Conjunctivae normal.     Pupils: Pupils are equal, round, and reactive to light.  Neck:     Trachea: Phonation normal.  Cardiovascular:     Rate and Rhythm: Normal rate and regular rhythm.  Pulmonary:     Effort: Pulmonary effort is normal.     Breath sounds: Normal breath sounds.  Chest:     Chest wall: No tenderness.  Abdominal:     General: There is no distension.     Palpations: Abdomen is soft.     Tenderness: There is abdominal tenderness (Epigastric, mild). There is no guarding.  Musculoskeletal:        General: Normal range of motion.     Cervical back: Normal range of motion and neck supple.  Skin:    General: Skin is warm and dry.  Neurological:     Mental Status: She is alert and oriented to person, place, and time.     Motor: No abnormal muscle tone.  Psychiatric:        Mood and Affect: Mood normal.        Behavior: Behavior normal.        Thought Content: Thought content normal.        Judgment: Judgment normal.    ED Results / Procedures / Treatments   Labs (all labs ordered are listed, but only abnormal results are displayed) Labs Reviewed - No data to display  EKG None  Radiology No results found.  Procedures Procedures   Medications Ordered in ED Medications - No data to display  ED Course  I have reviewed the triage vital signs and the nursing notes.  Pertinent  labs & imaging results that were available during my care of the patient were reviewed by me and considered in my medical decision making (see  chart for details).    MDM Rules/Calculators/A&P                           Patient Vitals for the past 24 hrs:  BP Temp Temp src Pulse Resp SpO2  04/22/21 2123 115/62 98.5 F (36.9 C) Oral 91 12 100 %      Medical Decision Making:  This patient is presenting for evaluation of nausea and vomiting with heartburn and abdominal pain, which does require a range of treatment options, and is a complaint that involves a moderate risk of morbidity and mortality. The differential diagnoses include Crohn's flare, gastroenteritis, nonspecific intra-abdominal process. I decided to review old records, and in summary Young adult female presenting with nonspecific symptoms and a history of irritable bowel syndrome and also stated Crohn's disease although no documentation of that in the EMR..  I did not require additional historical information from anyone.  Clinical Laboratory Tests Ordered, included CBC, Metabolic panel, Pregnancy test, and lipase . Review indicates normal except glucose slightly high, white count high, MCV low, hemoglobin low. Radiologic Tests Ordered, included CT abdomen pelvis.    Critical Interventions-clinical evaluation, laboratory testing, CT imaging ordered, observation  After These Interventions, the Patient was reevaluated and was found with initial reassuring evaluation, and laboratory testing, CT pending at time of transition of care  CRITICAL CARE-no Performed by: Mancel Bale  Nursing Notes Reviewed/ Care Coordinated Applicable Imaging Reviewed Interpretation of Laboratory Data incorporated into ED treatment  Care transferred to Dr. Read Drivers to follow-up on CT when it returns and reevaluate patient.    Final Clinical Impression(s) / ED Diagnoses Final diagnoses:  Nausea and vomiting in adult  Epigastric pain    Rx /  DC Orders ED Discharge Orders     None        Mancel Bale, MD 04/25/21 1319

## 2021-04-23 ENCOUNTER — Encounter (HOSPITAL_COMMUNITY): Payer: Self-pay

## 2021-04-23 ENCOUNTER — Emergency Department (HOSPITAL_COMMUNITY): Payer: Medicaid Other

## 2021-04-23 MED ORDER — PROMETHAZINE HCL 25 MG RE SUPP: 25.0000 mg | Freq: Four times a day (QID) | RECTAL | 1 refills | Status: DC | PRN

## 2021-04-23 MED ORDER — IOHEXOL 350 MG/ML SOLN
80.0000 mL | Freq: Once | INTRAVENOUS | Status: AC | PRN
  Administered 2021-04-23: 80 mL via INTRAVENOUS

## 2021-04-23 MED ORDER — ONDANSETRON HCL 4 MG/2ML IJ SOLN
4.0000 mg | Freq: Once | INTRAMUSCULAR | Status: AC
  Administered 2021-04-23: 4 mg via INTRAVENOUS
  Filled 2021-04-23: qty 2

## 2021-04-23 NOTE — ED Provider Notes (Signed)
Nursing notes and vitals signs, including pulse oximetry, reviewed.  Summary of this visit's results, reviewed by myself:  EKG:  EKG Interpretation  Date/Time:    Ventricular Rate:    PR Interval:    QRS Duration:   QT Interval:    QTC Calculation:   R Axis:     Text Interpretation:          Labs:  Results for orders placed or performed during the hospital encounter of 04/22/21 (from the past 24 hour(s))  Comprehensive metabolic panel     Status: Abnormal   Collection Time: 04/22/21 10:15 PM  Result Value Ref Range   Sodium 138 135 - 145 mmol/L   Potassium 4.1 3.5 - 5.1 mmol/L   Chloride 107 98 - 111 mmol/L   CO2 23 22 - 32 mmol/L   Glucose, Bld 106 (H) 70 - 99 mg/dL   BUN 12 6 - 20 mg/dL   Creatinine, Ser 0.26 0.44 - 1.00 mg/dL   Calcium 9.0 8.9 - 37.8 mg/dL   Total Protein 7.1 6.5 - 8.1 g/dL   Albumin 3.4 (L) 3.5 - 5.0 g/dL   AST 16 15 - 41 U/L   ALT 14 0 - 44 U/L   Alkaline Phosphatase 59 38 - 126 U/L   Total Bilirubin 0.5 0.3 - 1.2 mg/dL   GFR, Estimated >58 >85 mL/min   Anion gap 8 5 - 15  Lipase, blood     Status: None   Collection Time: 04/22/21 10:15 PM  Result Value Ref Range   Lipase 31 11 - 51 U/L  CBC with Differential     Status: Abnormal   Collection Time: 04/22/21 10:15 PM  Result Value Ref Range   WBC 13.2 (H) 4.0 - 10.5 K/uL   RBC 4.74 3.87 - 5.11 MIL/uL   Hemoglobin 10.4 (L) 12.0 - 15.0 g/dL   HCT 02.7 (L) 74.1 - 28.7 %   MCV 72.8 (L) 80.0 - 100.0 fL   MCH 21.9 (L) 26.0 - 34.0 pg   MCHC 30.1 30.0 - 36.0 g/dL   RDW 86.7 (H) 67.2 - 09.4 %   Platelets 429 (H) 150 - 400 K/uL   nRBC 0.0 0.0 - 0.2 %   Neutrophils Relative % 86 %   Neutro Abs 11.2 (H) 1.7 - 7.7 K/uL   Lymphocytes Relative 7 %   Lymphs Abs 1.0 0.7 - 4.0 K/uL   Monocytes Relative 7 %   Monocytes Absolute 0.9 0.1 - 1.0 K/uL   Eosinophils Relative 0 %   Eosinophils Absolute 0.0 0.0 - 0.5 K/uL   Basophils Relative 0 %   Basophils Absolute 0.0 0.0 - 0.1 K/uL   Immature  Granulocytes 0 %   Abs Immature Granulocytes 0.05 0.00 - 0.07 K/uL  hCG, quantitative, pregnancy     Status: None   Collection Time: 04/22/21 10:15 PM  Result Value Ref Range   hCG, Beta Chain, Quant, S <1 <5 mIU/mL    Imaging Studies: CT Abdomen Pelvis W Contrast  Result Date: 04/23/2021 CLINICAL DATA:  Nausea, vomiting.  Inflammatory bowel disease. EXAM: CT ABDOMEN AND PELVIS WITH CONTRAST TECHNIQUE: Multidetector CT imaging of the abdomen and pelvis was performed using the standard protocol following bolus administration of intravenous contrast. CONTRAST:  94mL OMNIPAQUE IOHEXOL 350 MG/ML SOLN COMPARISON:  None. FINDINGS: Lower chest: No acute abnormality. Hepatobiliary: No focal liver abnormality is seen. Status post cholecystectomy. No biliary dilatation. Pancreas: Unremarkable Spleen: Unremarkable Adrenals/Urinary Tract: Adrenal glands are unremarkable. Kidneys are  normal, without renal calculi, focal lesion, or hydronephrosis. Bladder is unremarkable. Stomach/Bowel: Moderate stool throughout the colon without evidence of obstruction. The stomach, small bowel, and large bowel are otherwise unremarkable. Appendix normal. No evidence of obstruction or focal inflammation. No free intraperitoneal gas. Trace free fluid within the pelvis may be physiologic in a female patient of this age. Vascular/Lymphatic: No significant vascular findings are present. No enlarged abdominal or pelvic lymph nodes. Reproductive: Uterus and bilateral adnexa are unremarkable. Other: No abdominal wall hernia or abnormality. No abdominopelvic ascites. Musculoskeletal: No acute bone abnormality. No lytic or blastic bone lesions. IMPRESSION: Moderate stool throughout the colon without evidence of obstruction. Otherwise unremarkable examination of the abdomen and pelvis. Electronically Signed   By: Helyn Numbers MD   On: 04/23/2021 01:10    2:10 AM Patient's vomiting under control.  She was advised of CT findings showing  moderate stool burden.  She was advised that we will avoid any further narcotics as these can worsen constipation.  She is still having some nausea and we will give another dose of Zofran.  Her abdomen is soft with epigastric tenderness, likely from profuse vomiting.  She is requesting a prescription for Phenergan suppositories.     Alexis Petty, Alexis Ruiz, MD 04/23/21 [phone]

## 2021-06-23 ENCOUNTER — Other Ambulatory Visit: Payer: Self-pay

## 2021-06-23 ENCOUNTER — Encounter (HOSPITAL_COMMUNITY): Payer: Self-pay

## 2021-06-23 ENCOUNTER — Emergency Department (HOSPITAL_COMMUNITY): Payer: Medicaid Other

## 2021-06-23 ENCOUNTER — Emergency Department (HOSPITAL_COMMUNITY)
Admission: EM | Admit: 2021-06-23 | Discharge: 2021-06-23 | Disposition: A | Discharge status: Home / Self Care | Payer: Medicaid Other | Attending: Emergency Medicine | Admitting: Emergency Medicine

## 2021-06-23 DIAGNOSIS — R112 Nausea with vomiting, unspecified: Secondary | ICD-10-CM | POA: Insufficient documentation

## 2021-06-23 DIAGNOSIS — R1031 Right lower quadrant pain: Secondary | ICD-10-CM | POA: Insufficient documentation

## 2021-06-23 DIAGNOSIS — N9489 Other specified conditions associated with female genital organs and menstrual cycle: Secondary | ICD-10-CM | POA: Diagnosis not present

## 2021-06-23 DIAGNOSIS — Z20822 Contact with and (suspected) exposure to covid-19: Secondary | ICD-10-CM | POA: Diagnosis not present

## 2021-06-23 DIAGNOSIS — R197 Diarrhea, unspecified: Secondary | ICD-10-CM | POA: Insufficient documentation

## 2021-06-23 DIAGNOSIS — I880 Nonspecific mesenteric lymphadenitis: Secondary | ICD-10-CM

## 2021-06-23 LAB — CBC WITH DIFFERENTIAL/PLATELET
Abs Immature Granulocytes: 0.08 10*3/uL — ABNORMAL HIGH (ref 0.00–0.07)
Basophils Absolute: 0 10*3/uL (ref 0.0–0.1)
Basophils Relative: 0 %
Eosinophils Absolute: 0.1 10*3/uL (ref 0.0–0.5)
Eosinophils Relative: 1 %
HCT: 33.8 % — ABNORMAL LOW (ref 36.0–46.0)
Hemoglobin: 9.8 g/dL — ABNORMAL LOW (ref 12.0–15.0)
Immature Granulocytes: 1 %
Lymphocytes Relative: 7 %
Lymphs Abs: 1 10*3/uL (ref 0.7–4.0)
MCH: 21.2 pg — ABNORMAL LOW (ref 26.0–34.0)
MCHC: 29 g/dL — ABNORMAL LOW (ref 30.0–36.0)
MCV: 73.2 fL — ABNORMAL LOW (ref 80.0–100.0)
Monocytes Absolute: 0.8 10*3/uL (ref 0.1–1.0)
Monocytes Relative: 6 %
Neutro Abs: 11.4 10*3/uL — ABNORMAL HIGH (ref 1.7–7.7)
Neutrophils Relative %: 85 %
Platelets: 409 10*3/uL — ABNORMAL HIGH (ref 150–400)
RBC: 4.62 MIL/uL (ref 3.87–5.11)
RDW: 18.5 % — ABNORMAL HIGH (ref 11.5–15.5)
WBC: 13.5 10*3/uL — ABNORMAL HIGH (ref 4.0–10.5)
nRBC: 0 % (ref 0.0–0.2)

## 2021-06-23 LAB — URINALYSIS, ROUTINE W REFLEX MICROSCOPIC
Bilirubin Urine: NEGATIVE
Glucose, UA: NEGATIVE mg/dL
Ketones, ur: NEGATIVE mg/dL
Nitrite: NEGATIVE
Protein, ur: NEGATIVE mg/dL
RBC / HPF: >50 RBC/hpf — ABNORMAL HIGH (ref 0–5)
Specific Gravity, Urine: 1.025 (ref 1.005–1.030)
pH: 7 (ref 5.0–8.0)

## 2021-06-23 LAB — COMPREHENSIVE METABOLIC PANEL
ALT: 20 U/L (ref 0–44)
AST: 32 U/L (ref 15–41)
Albumin: 3.5 g/dL (ref 3.5–5.0)
Alkaline Phosphatase: 67 U/L (ref 38–126)
Anion gap: 12 (ref 5–15)
BUN: 11 mg/dL (ref 6–20)
CO2: 20 mmol/L — ABNORMAL LOW (ref 22–32)
Calcium: 8.5 mg/dL — ABNORMAL LOW (ref 8.9–10.3)
Chloride: 106 mmol/L (ref 98–111)
Creatinine, Ser: 0.82 mg/dL (ref 0.44–1.00)
GFR, Estimated: >60 mL/min (ref >60)
Glucose, Bld: 98 mg/dL (ref 70–99)
Potassium: 3.9 mmol/L (ref 3.5–5.1)
Sodium: 138 mmol/L (ref 135–145)
Total Bilirubin: 0.3 mg/dL (ref 0.3–1.2)
Total Protein: 7.2 g/dL (ref 6.5–8.1)

## 2021-06-23 LAB — RESP PANEL BY RT-PCR (FLU A&B, COVID) ARPGX2
Influenza A by PCR: NEGATIVE
Influenza B by PCR: NEGATIVE
SARS Coronavirus 2 by RT PCR: NEGATIVE

## 2021-06-23 LAB — HCG, QUANTITATIVE, PREGNANCY: hCG, Beta Chain, Quant, S: <1 m[IU]/mL (ref <5)

## 2021-06-23 LAB — LIPASE, BLOOD: Lipase: 21 U/L (ref 11–51)

## 2021-06-23 MED ORDER — MORPHINE SULFATE (PF) 4 MG/ML IV SOLN
6.0000 mg | Freq: Once | INTRAVENOUS | Status: AC
  Administered 2021-06-23: 6 mg via INTRAVENOUS
  Filled 2021-06-23: qty 2

## 2021-06-23 MED ORDER — LORAZEPAM 2 MG/ML IJ SOLN
0.5000 mg | Freq: Once | INTRAMUSCULAR | Status: AC
  Administered 2021-06-23: 0.5 mg via INTRAVENOUS
  Filled 2021-06-23: qty 1

## 2021-06-23 MED ORDER — ONDANSETRON 8 MG PO TBDP: 8.0000 mg | ORAL_TABLET | Freq: Three times a day (TID) | ORAL | 0 refills | Status: AC | PRN

## 2021-06-23 MED ORDER — ONDANSETRON HCL 4 MG/2ML IJ SOLN
4.0000 mg | Freq: Once | INTRAMUSCULAR | Status: AC
  Administered 2021-06-23: 4 mg via INTRAVENOUS
  Filled 2021-06-23: qty 2

## 2021-06-23 MED ORDER — ONDANSETRON 8 MG PO TBDP
8.0000 mg | ORAL_TABLET | Freq: Once | ORAL | Status: AC
  Administered 2021-06-23: 8 mg via ORAL
  Filled 2021-06-23: qty 1

## 2021-06-23 MED ORDER — IOHEXOL 350 MG/ML SOLN
100.0000 mL | Freq: Once | INTRAVENOUS | Status: AC | PRN
  Administered 2021-06-23: 80 mL via INTRAVENOUS

## 2021-06-23 MED ORDER — LACTATED RINGERS IV SOLN: INTRAVENOUS | Status: DC

## 2021-06-23 MED ORDER — LACTATED RINGERS IV BOLUS
2000.0000 mL | Freq: Once | INTRAVENOUS | Status: AC
  Administered 2021-06-23: 1000 mL via INTRAVENOUS

## 2021-06-23 NOTE — ED Notes (Signed)
Pt d/c home per MD order. Discharge summary reviewed with pt , pt verbalizes understanding. . No s/s of acute distress noted at discharge. Ambulatory off unit. Reports mother is discharge ride home.

## 2021-06-23 NOTE — ED Provider Notes (Signed)
[facility]-EMERGENCY DEPT Provider Note   CSN: [id number] Arrival date & time: 06/23/21  0439     History Chief Complaint  Patient presents with   Emesis    Alexis Petty is a 20 y.o. female.  20 year old female with history of Crohn's disease who presents with nausea vomiting diarrhea time several days.  No fever or chills.  States has had diffuse abdominal cramping.  Diarrhea has been watery and nonbloody.  Emesis has been nonbilious.  Denies any urinary symptoms.  Patient has been using home Zofran without relief.      Past Medical History:  Diagnosis Date   Abdominal pain    Crohn's colitis (HCC)    Diarrhea    Dysmenorrhea    Seen at [facility]   IBS (irritable bowel syndrome)    IBS (irritable bowel syndrome)    Iron deficiency anemia     Patient Active Problem List   Diagnosis Date Noted   Abnormal uterine bleeding (AUB) 12/29/2014   Anemia due to blood loss, acute 10/03/2013   Abnormal uterine bleeding 07/07/2013   Adiposity 10/17/2012   Abdominal pain    Diarrhea    IBS (irritable bowel syndrome)     Past Surgical History:  Procedure Laterality Date   CHOLECYSTECTOMY     TONSILLECTOMY Bilateral    WISDOM TOOTH EXTRACTION       OB History     Gravida  0   Para      Term      Preterm      AB      Living         SAB      IAB      Ectopic      Multiple      Live Births              Family History  Problem Relation Age of Onset   Heart disease Mother    Diabetes Father    Hypertension Father    Diabetes Maternal Grandmother    Cancer Paternal Grandmother    Cancer Paternal Grandfather     Social History   Tobacco Use   Smoking status: Never   Smokeless tobacco: Never  Vaping Use   Vaping Use: Never used  Substance Use Topics   Alcohol use: No   Drug use: No    Home Medications Prior to Admission medications   Medication Sig Start Date End Date Taking? Authorizing Provider  ALAYCEN 1/35  tablet Take 1 tablet by mouth daily. 08/19/20   [provider]  famotidine (PEPCID) 20 MG tablet Take 20 mg by mouth daily as needed for heartburn or indigestion (take when protonix isn't helping).    [provider]  hydrOXYzine (ATARAX/VISTARIL) 25 MG tablet Take 25 mg by mouth 2 (two) times daily. 04/11/21   [provider]  levothyroxine (SYNTHROID) 50 MCG tablet Take 50 mcg by mouth daily before breakfast. 03/31/21   [provider]  ondansetron (ZOFRAN-ODT) 8 MG disintegrating tablet Take 8 mg by mouth every 8 (eight) hours as needed for nausea or vomiting.  10/07/15   [provider]  pantoprazole (PROTONIX) 40 MG tablet Take 40 mg by mouth 2 (two) times daily. 08/24/20   [provider]  promethazine (PHENERGAN) 12.5 MG tablet Take 12.5 mg by mouth every 8 (eight) hours as needed for nausea or vomiting. 02/22/21   [provider]  promethazine (PHENERGAN) 25 MG suppository Place 1 suppository (25 mg total)  rectally every 6 (six) hours as needed for nausea or vomiting. 04/23/21   Molpus, John, MD    Allergies    Coconut (cocos nucifera) allergy skin test, Ibuprofen, Reglan [metoclopramide], Tylenol [acetaminophen], and Haldol [haloperidol]  Review of Systems   Review of Systems  All other systems reviewed and are negative.  Physical Exam Updated Vital Signs BP (!) 148/87 (BP Location: Right Arm)   Pulse 79   Temp 99.1 F (37.3 C) (Oral)   Resp 18   Ht 1.6 m (5\' 3" )   Wt 79.4 kg   SpO2 100%   BMI 31.00 kg/m   Physical Exam Vitals and nursing note reviewed.  Constitutional:      General: She is not in acute distress.    Appearance: Normal appearance. She is well-developed. She is not toxic-appearing.  HENT:     Head: Normocephalic and atraumatic.  Eyes:     General: Lids are normal.     Conjunctiva/sclera: Conjunctivae normal.     Pupils: Pupils are equal, round, and reactive to light.  Neck:     Thyroid: No  thyroid mass.     Trachea: No tracheal deviation.  Cardiovascular:     Rate and Rhythm: Normal rate and regular rhythm.     Heart sounds: Normal heart sounds. No murmur heard.   No gallop.  Pulmonary:     Effort: Pulmonary effort is normal. No respiratory distress.     Breath sounds: Normal breath sounds. No stridor. No decreased breath sounds, wheezing, rhonchi or rales.  Abdominal:     General: There is no distension.     Palpations: Abdomen is soft.     Tenderness: There is abdominal tenderness in the right lower quadrant and epigastric area. There is no rebound.  Musculoskeletal:        General: No tenderness. Normal range of motion.     Cervical back: Normal range of motion and neck supple.  Skin:    General: Skin is warm and dry.     Findings: No abrasion or rash.  Neurological:     Mental Status: She is alert and oriented to person, place, and time. Mental status is at baseline.     GCS: GCS eye subscore is 4. GCS verbal subscore is 5. GCS motor subscore is 6.     Cranial Nerves: Cranial nerves are intact. No cranial nerve deficit.     Sensory: No sensory deficit.     Motor: Motor function is intact.  Psychiatric:        Attention and Perception: Attention normal.        Speech: Speech normal.        Behavior: Behavior normal.    ED Results / Procedures / Treatments   Labs (all labs ordered are listed, but only abnormal results are displayed) Labs Reviewed  COMPREHENSIVE METABOLIC PANEL - Abnormal; Notable for the following components:      Result Value   CO2 20 (*)    Calcium 8.5 (*)    All other components within normal limits  CBC WITH DIFFERENTIAL/PLATELET - Abnormal; Notable for the following components:   WBC 13.5 (*)    Hemoglobin 9.8 (*)    HCT 33.8 (*)    MCV 73.2 (*)    MCH 21.2 (*)    MCHC 29.0 (*)    RDW 18.5 (*)    Platelets 409 (*)    Neutro Abs 11.4 (*)    Abs Immature Granulocytes 0.08 (*)    All  other components within normal limits  RESP  PANEL BY RT-PCR (FLU A&B, COVID) ARPGX2  LIPASE, BLOOD  HCG, QUANTITATIVE, PREGNANCY  URINALYSIS, ROUTINE W REFLEX MICROSCOPIC    EKG None  Radiology No results found.  Procedures Procedures   Medications Ordered in ED Medications  lactated ringers infusion (has no administration in time range)  lactated ringers bolus 2,000 mL (has no administration in time range)  morphine 4 MG/ML injection 6 mg (has no administration in time range)  LORazepam (ATIVAN) injection 0.5 mg (has no administration in time range)    ED Course  I have reviewed the triage vital signs and the nursing notes.  Pertinent labs & imaging results that were available during my care of the patient were reviewed by me and considered in my medical decision making (see chart for details).    MDM Rules/Calculators/A&P                           Patient given IV fluids here along with morphine and Ativan as well as antiemetics.  Feels better at this time.  Abdominal CT consistent with mesenteric adenitis.  Feels better and will prescribe Zofran and sent home Final Clinical Impression(s) / ED Diagnoses Final diagnoses:  None    Rx / DC Orders ED Discharge Orders     None        Lorre Nick, MD 06/23/21 1201

## 2021-06-23 NOTE — ED Notes (Signed)
ED Provider at bedside. 

## 2021-06-23 NOTE — ED Triage Notes (Signed)
Pt arrived via EMS, from home, c/o vomiting/diarrhea x3 days. Hx of Crohn's  20 G L hand 4 mg zofran given by EMS

## 2021-06-23 NOTE — ED Notes (Signed)
Pt ambulatory with steady gait to restroom will provide urine

## 2021-06-23 NOTE — ED Provider Notes (Signed)
Emergency Medicine Provider Triage Evaluation Note  Alexis Petty , a 20 y.o. female  was evaluated in triage.  Pt complains of N/V/D x 3 days. TNTC episodes of emesis & diarrhea, non bloody, associated generalized abdominal discomfort without alleviating/aggravating factors. States this feels different from her crohns issues as she is typically constipated. Denies recent foreign travel or abx.   Review of Systems  Positive: N/V/D, abdominal pain Negative: Dysuria, fever  Physical Exam  BP (!) 167/86 (BP Location: Right Arm)   Pulse (!) 112   Temp 98.9 F (37.2 C) (Oral)   Resp 20   Ht 5\' 3"  (1.6 m)   Wt 79.4 kg   SpO2 95%   BMI 31.00 kg/m  Gen:   Awake, no distress   Resp:  Normal effort  MSK:   Moves extremities without difficulty  Other:  Mild generalized abdominal tenderness.   Medical Decision Making  Medically screening exam initiated at 5:01 AM.  Appropriate orders placed.  Alexis Petty was informed that the remainder of the evaluation will be completed by another provider, this initial triage assessment does not replace that evaluation, and the importance of remaining in the ED until their evaluation is complete.  N/V/D.    06/23/21 06/25/21, MD 06/23/21 [phone]

## 2021-06-23 NOTE — ED Notes (Signed)
Pt endorsing nausea, message to EDP

## 2021-06-23 NOTE — ED Notes (Signed)
Pt endorses discoloration to abdomen and R arm, pt endorses occurred after applying heating pad in Jan.  Sores noted to abdomen that pt reports appeared recently within last month.

## 2021-07-01 ENCOUNTER — Emergency Department (HOSPITAL_COMMUNITY)
Admission: EM | Admit: 2021-07-01 | Discharge: 2021-07-01 | Disposition: A | Discharge status: Home / Self Care | Payer: Medicaid Other | Attending: Emergency Medicine | Admitting: Emergency Medicine

## 2021-07-01 ENCOUNTER — Other Ambulatory Visit: Payer: Self-pay

## 2021-07-01 ENCOUNTER — Encounter (HOSPITAL_COMMUNITY): Payer: Self-pay | Admitting: *Deleted

## 2021-07-01 DIAGNOSIS — R109 Unspecified abdominal pain: Secondary | ICD-10-CM

## 2021-07-01 DIAGNOSIS — R112 Nausea with vomiting, unspecified: Secondary | ICD-10-CM | POA: Insufficient documentation

## 2021-07-01 DIAGNOSIS — R197 Diarrhea, unspecified: Secondary | ICD-10-CM | POA: Diagnosis not present

## 2021-07-01 HISTORY — DX: Disorder of thyroid, unspecified: E07.9

## 2021-07-01 LAB — COMPREHENSIVE METABOLIC PANEL
ALT: 19 U/L (ref 0–44)
AST: 24 U/L (ref 15–41)
Albumin: 4.3 g/dL (ref 3.5–5.0)
Alkaline Phosphatase: 70 U/L (ref 38–126)
Anion gap: 11 (ref 5–15)
BUN: 11 mg/dL (ref 6–20)
CO2: 23 mmol/L (ref 22–32)
Calcium: 9 mg/dL (ref 8.9–10.3)
Chloride: 105 mmol/L (ref 98–111)
Creatinine, Ser: 0.92 mg/dL (ref 0.44–1.00)
GFR, Estimated: >60 mL/min (ref >60)
Glucose, Bld: 149 mg/dL — ABNORMAL HIGH (ref 70–99)
Potassium: 3.7 mmol/L (ref 3.5–5.1)
Sodium: 139 mmol/L (ref 135–145)
Total Bilirubin: 0.5 mg/dL (ref 0.3–1.2)
Total Protein: 8.1 g/dL (ref 6.5–8.1)

## 2021-07-01 LAB — CBC
HCT: 36.7 % (ref 36.0–46.0)
Hemoglobin: 10.6 g/dL — ABNORMAL LOW (ref 12.0–15.0)
MCH: 21.1 pg — ABNORMAL LOW (ref 26.0–34.0)
MCHC: 28.9 g/dL — ABNORMAL LOW (ref 30.0–36.0)
MCV: 73.1 fL — ABNORMAL LOW (ref 80.0–100.0)
Platelets: 319 10*3/uL (ref 150–400)
RBC: 5.02 MIL/uL (ref 3.87–5.11)
RDW: 18.4 % — ABNORMAL HIGH (ref 11.5–15.5)
WBC: 20.1 10*3/uL — ABNORMAL HIGH (ref 4.0–10.5)
nRBC: 0 % (ref 0.0–0.2)

## 2021-07-01 LAB — LIPASE, BLOOD: Lipase: 35 U/L (ref 11–51)

## 2021-07-01 LAB — URINALYSIS, ROUTINE W REFLEX MICROSCOPIC
Bilirubin Urine: NEGATIVE
Glucose, UA: NEGATIVE mg/dL
Hgb urine dipstick: NEGATIVE
Ketones, ur: NEGATIVE mg/dL
Leukocytes,Ua: NEGATIVE
Nitrite: NEGATIVE
Protein, ur: NEGATIVE mg/dL
Specific Gravity, Urine: 1.03 (ref 1.005–1.030)
pH: 6 (ref 5.0–8.0)

## 2021-07-01 LAB — I-STAT BETA HCG BLOOD, ED (MC, WL, AP ONLY): I-stat hCG, quantitative: <5 m[IU]/mL (ref <5)

## 2021-07-01 MED ORDER — FENTANYL CITRATE PF 50 MCG/ML IJ SOSY
100.0000 ug | PREFILLED_SYRINGE | INTRAMUSCULAR | Status: DC | PRN
  Administered 2021-07-01: 100 ug via INTRAVENOUS
  Filled 2021-07-01: qty 2

## 2021-07-01 MED ORDER — SODIUM CHLORIDE 0.9 % IV BOLUS
1000.0000 mL | Freq: Once | INTRAVENOUS | Status: AC
  Administered 2021-07-01: 1000 mL via INTRAVENOUS

## 2021-07-01 MED ORDER — ONDANSETRON HCL 4 MG/2ML IJ SOLN
4.0000 mg | Freq: Once | INTRAMUSCULAR | Status: AC
  Administered 2021-07-01: 4 mg via INTRAVENOUS
  Filled 2021-07-01: qty 2

## 2021-07-01 NOTE — Discharge Instructions (Signed)
The testing today is reassuring.  Your laboratory tests are near their baseline.  Advance your diet as tolerated to a regular diet over the next day or 2.  Make sure you are drinking plenty of fluids.  Call the on-call gastroenterology service for a follow-up appointment, to get a second opinion for further care and treatment.

## 2021-07-01 NOTE — ED Triage Notes (Addendum)
Pt BIB EMS and presents with nausea and vomiting since 7am this morning.  Pt also reports diffuse abdominal pain.  Hx of Chrohn's and gall bladder removal.  EMS reports she's tachycardic, hypertensive, and warm to the touch. Pt recently seen and was told she had no blockages in her abdomen but had swollen lymph nodes. Pt a/o x 4 and ambulatory.  EMG gave 8mg  IV zofran and of fentanyl enroute with limited improvement of symptoms.

## 2021-07-01 NOTE — ED Provider Notes (Signed)
[facility]-EMERGENCY DEPT Provider Note   CSN: [id number] Arrival date & time: 07/01/21  1108     History Chief Complaint  Patient presents with   Emesis   Abdominal Pain    Alexis Petty is a 20 y.o. female.  HPI She presents for recurrent abdominal pain with vomiting and diarrhea.  She was transferred here today by EMS who empirically treated her with IV Zofran and fentanyl during transport.  The patient did not report improvement of her symptoms with this treatment.  Onset of symptoms several days ago following improvement after treatment on 06/23/2021.  At that time she was diagnosed with mesenteric adenitis.  She has previously had this diagnosis as well.  She states she has Crohn's disease and is followed closely by gastroenterology.  Earlier this month she was prescribed long-term prescriptions for both Zofran and Phenergan to treat vomiting.  She has had 4 CT scans of the abdomen pelvis in the last 11 months.  She denies blood in emesis or diarrhea.  She has a prior history of irritable bowel syndrome.  She had an initial gastroenterology consultation by her provider in April 2022.  At that time she was reporting symptoms consistent with IBS.  He has reported that her prior suspected Crohn's disease, was proven to not to be present based on biopsies.  She has had a cholecystectomy.  She had EGD and colonoscopy in January 2022 which was normal.  Patient's mother has Barrett's esophagitis.  The patient states she has tried to follow-up with low-power gastroenterology but been rebuffed from getting an appointment for some reason which she is unable to explain.    Past Medical History:  Diagnosis Date   Abdominal pain    Crohn's colitis (HCC)    Diarrhea    Dysmenorrhea    Seen at [facility]   IBS (irritable bowel syndrome)    IBS (irritable bowel syndrome)    Iron deficiency anemia    Thyroid disease    hyperthyroid    Patient Active Problem List    Diagnosis Date Noted   Abnormal uterine bleeding (AUB) 12/29/2014   Anemia due to blood loss, acute 10/03/2013   Abnormal uterine bleeding 07/07/2013   Adiposity 10/17/2012   Abdominal pain    Diarrhea    IBS (irritable bowel syndrome)     Past Surgical History:  Procedure Laterality Date   CHOLECYSTECTOMY     TONSILLECTOMY Bilateral    WISDOM TOOTH EXTRACTION       OB History     Gravida  0   Para      Term      Preterm      AB      Living         SAB      IAB      Ectopic      Multiple      Live Births              Family History  Problem Relation Age of Onset   Heart disease Mother    Diabetes Father    Hypertension Father    Diabetes Maternal Grandmother    Cancer Paternal Grandmother    Cancer Paternal Grandfather     Social History   Tobacco Use   Smoking status: Never   Smokeless tobacco: Never  Vaping Use   Vaping Use: Never used  Substance Use Topics   Alcohol use: No   Drug use: No    Home  Medications Prior to Admission medications   Medication Sig Start Date End Date Taking? Authorizing Provider  ALAYCEN 1/35 tablet Take 1 tablet by mouth daily. 08/19/20  Yes [provider]  hydrOXYzine (ATARAX/VISTARIL) 25 MG tablet Take 25 mg by mouth 2 (two) times daily. 04/11/21  Yes [provider]  levothyroxine (SYNTHROID) 50 MCG tablet Take 50 mcg by mouth daily before breakfast. 03/31/21  Yes [provider]  ondansetron (ZOFRAN ODT) 8 MG disintegrating tablet Take 1 tablet (8 mg total) by mouth every 8 (eight) hours as needed for nausea or vomiting. 06/23/21  Yes Lorre Nick, MD  pantoprazole (PROTONIX) 40 MG tablet Take 40 mg by mouth 2 (two) times daily. 08/24/20  Yes [provider]  promethazine (PHENERGAN) 12.5 MG tablet Take 12.5 mg by mouth every 8 (eight) hours as needed for nausea or vomiting. 02/22/21  Yes [provider]  promethazine (PHENERGAN) 25 MG suppository Place 1  suppository (25 mg total) rectally every 6 (six) hours as needed for nausea or vomiting. Patient not taking: Reported on 07/01/2021 04/23/21   Molpus, John, MD    Allergies    Coconut (cocos nucifera) allergy skin test, Ibuprofen, Reglan [metoclopramide], Tylenol [acetaminophen], and Haldol [haloperidol]  Review of Systems   Review of Systems  All other systems reviewed and are negative.  Physical Exam Updated Vital Signs BP 120/74   Pulse (!) 57   Temp 99.1 F (37.3 C) (Oral)   Resp 16   Ht 5\' 3"  (1.6 m)   Wt 81.6 kg   LMP 06/20/2021 (Exact Date)   SpO2 100%   BMI 31.89 kg/m   Physical Exam Vitals and nursing note reviewed.  Constitutional:      General: She is not in acute distress.    Appearance: She is well-developed. She is obese. She is not ill-appearing, toxic-appearing or diaphoretic.  HENT:     Head: Normocephalic and atraumatic.     Right Ear: External ear normal.     Left Ear: External ear normal.  Eyes:     Conjunctiva/sclera: Conjunctivae normal.     Pupils: Pupils are equal, round, and reactive to light.  Neck:     Trachea: Phonation normal.  Cardiovascular:     Rate and Rhythm: Normal rate and regular rhythm.     Heart sounds: Normal heart sounds.  Pulmonary:     Effort: Pulmonary effort is normal.     Breath sounds: Normal breath sounds.  Abdominal:     General: There is no distension.     Palpations: Abdomen is soft.     Tenderness: There is abdominal tenderness (Diffuse, mild).  Musculoskeletal:        General: Normal range of motion.     Cervical back: Normal range of motion and neck supple.  Skin:    General: Skin is warm and dry.  Neurological:     Mental Status: She is alert and oriented to person, place, and time.     Cranial Nerves: No cranial nerve deficit.     Sensory: No sensory deficit.     Motor: No abnormal muscle tone.     Coordination: Coordination normal.  Psychiatric:        Mood and Affect: Mood normal.        Behavior:  Behavior normal.        Thought Content: Thought content normal.        Judgment: Judgment normal.    ED Results / Procedures / Treatments   Labs (  all labs ordered are listed, but only abnormal results are displayed) Labs Reviewed  COMPREHENSIVE METABOLIC PANEL - Abnormal; Notable for the following components:      Result Value   Glucose, Bld 149 (*)    All other components within normal limits  CBC - Abnormal; Notable for the following components:   WBC 20.1 (*)    Hemoglobin 10.6 (*)    MCV 73.1 (*)    MCH 21.1 (*)    MCHC 28.9 (*)    RDW 18.4 (*)    All other components within normal limits  LIPASE, BLOOD  URINALYSIS, ROUTINE W REFLEX MICROSCOPIC  I-STAT BETA HCG BLOOD, ED ([facility], [facility], [facility] ONLY)    EKG None  Radiology No results found.  Procedures Procedures   Medications Ordered in ED Medications  fentaNYL (SUBLIMAZE) injection 100 mcg (100 mcg Intravenous Given 07/01/21 1251)  ondansetron (ZOFRAN) injection 4 mg (4 mg Intravenous Given 07/01/21 1249)  sodium chloride 0.9 % bolus 1,000 mL (1,000 mLs Intravenous New Bag/Given 07/01/21 1250)    ED Course  I have reviewed the triage vital signs and the nursing notes.  Pertinent labs & imaging results that were available during my care of the patient were reviewed by me and considered in my medical decision making (see chart for details).  Clinical Course as of 07/01/21 1428  Sat Jul 01, 2021  1207 The PDMP does not show any narcotic prescriptions given to this patient within the last 12 months. [EW]    Clinical Course User Index [EW] Mancel Bale, MD   MDM Rules/Calculators/A&P                            Patient Vitals for the past 24 hrs:  BP Temp Temp src Pulse Resp SpO2 Height Weight  07/01/21 1346 -- -- -- (!) 57 -- 100 % -- --  07/01/21 1341 -- -- -- (!) 56 -- 100 % -- --  07/01/21 1336 -- -- -- (!) 56 -- 100 % -- --  07/01/21 1331 120/74 -- -- 60 -- 100 % -- --  07/01/21 1326 -- -- -- 65 -- 100 % --  --  07/01/21 1321 -- -- -- (!) 56 -- 100 % -- --  07/01/21 1316 -- -- -- 90 -- 94 % -- --  07/01/21 1306 -- -- -- 70 -- 99 % -- --  07/01/21 1301 124/66 -- -- 66 -- 98 % -- --  07/01/21 1256 -- -- -- 70 -- 100 % -- --  07/01/21 1251 -- -- -- 71 -- 100 % -- --  07/01/21 1246 -- -- -- 73 -- 99 % -- --  07/01/21 1245 105/63 -- -- 73 16 99 % -- --  07/01/21 1231 -- -- -- 82 -- 98 % -- --  07/01/21 1226 -- -- -- 81 -- 98 % -- --  07/01/21 1221 -- -- -- 84 -- 98 % -- --  07/01/21 1216 -- -- -- 87 -- 98 % -- --  07/01/21 1211 -- -- -- 93 -- 100 % -- --  07/01/21 1206 -- -- -- 92 -- (!) 88 % -- --  07/01/21 1200 108/63 -- -- (!) 106 16 100 % -- --  07/01/21 1156 -- -- -- (!) 120 -- 99 % -- --  07/01/21 1151 -- -- -- (!) 108 -- 97 % -- --  07/01/21 1146 -- -- -- 89 -- 96 % -- --  07/01/21 1141 -- -- -- 92 -- 100 % -- --  07/01/21 1136 -- -- -- 89 -- 100 % -- --  07/01/21 1131 (!) 167/95 -- -- (!) 103 -- 100 % -- --  07/01/21 1126 -- -- -- (!) 104 -- 100 % -- --  07/01/21 1122 -- -- -- -- -- -- 5\' 3"  (1.6 m) 81.6 kg  07/01/21 1121 -- -- -- (!) 108 -- 100 % -- --  07/01/21 1117 -- -- -- -- -- 100 % 5\' 3"  (1.6 m) 81.6 kg  07/01/21 1116 (!) 157/109 99.1 F (37.3 C) Oral (!) 102 -- 100 % -- --    2:23 PM Reevaluation with update and discussion. After initial assessment and treatment, an updated evaluation reveals she states her pain is better after treatment, and she just feels "cold," now.  Findings discussed with patient, and her mother at the bedside, patient is agreeable to discharge with referral to GI.  All questions answered.   Medical Decision Making:  This patient is presenting for evaluation of recurrent abdominal pain with vomiting, which does require a range of treatment options, and is a complaint that involves a moderate risk of morbidity and mortality. The differential diagnoses include gastritis, irritable bowel syndrome, inflammatory bowel disease. I decided to  review old records, and in summary Young adult female, with prior diagnosis of Crohn's but apparently was disproven by most recent biopsies.  She is not comfortable with her current GI provider and is requesting a second opinion.  She has had 4 CT scans, done within the last 1 months.  She is at risk for radiation induced damage with multiple repeated CTs..  I obtained additional historical information from mother at bedside.  Clinical Laboratory Tests Ordered, included CBC, Metabolic panel, Pregnancy test, and lipase . Review indicates normal except glucose high, white count high, hemoglobin low, MCV low.   Critical Interventions-clinical evaluation, laboratory testing, observation and reassessment  Note that urinalysis was ordered for screening purposes, not to look for a urinary tract infection.  The patient does not have urinary tract symptoms including dysuria or urinary frequency.  After These Interventions, the Patient was reevaluated and was found stable for discharge.  Screening evaluation is reassuring.  Patient with recurrent symptoms, and multiple prior CTs that did not show acute significant problems.  She is referred to on-call GI for further evaluation and treatment as needed.  CRITICAL CARE-no Performed by: 07/03/21  Nursing Notes Reviewed/ Care Coordinated Applicable Imaging Reviewed Interpretation of Laboratory Data incorporated into ED treatment  The patient appears reasonably screened and/or stabilized for discharge and I doubt any other medical condition or other [facility] requiring further screening, evaluation, or treatment in the ED at this time prior to discharge.  Plan: Home Medications-continue usual; Home Treatments-great advance diet and activity; return here if the recommended treatment, does not improve the symptoms; Recommended follow up-on-call GI as soon as possible for further evaluation     Final Clinical Impression(s) / ED Diagnoses Final diagnoses:   Abdominal pain, unspecified abdominal location  Nausea vomiting and diarrhea    Rx / DC Orders ED Discharge Orders     None        Mancel Bale, MD 07/01/21 1429

## 2021-10-28 NOTE — Therapy (Incomplete)
OUTPATIENT PHYSICAL THERAPY THORACOLUMBAR EVALUATION   Patient Name: Alexis Petty MRN: [id number] DOB:01-05-2001, 21 y.o., female Today's Date: 10/28/2021    Past Medical History:  Diagnosis Date   Abdominal pain    Crohn's colitis (HCC)    Diarrhea    Dysmenorrhea    Seen at [facility]   IBS (irritable bowel syndrome)    IBS (irritable bowel syndrome)    Iron deficiency anemia    Thyroid disease    hyperthyroid   Past Surgical History:  Procedure Laterality Date   CHOLECYSTECTOMY     TONSILLECTOMY Bilateral    WISDOM TOOTH EXTRACTION     Patient Active Problem List   Diagnosis Date Noted   Abnormal uterine bleeding (AUB) 12/29/2014   Anemia due to blood loss, acute 10/03/2013   Abnormal uterine bleeding 07/07/2013   Adiposity 10/17/2012   Abdominal pain    Diarrhea    IBS (irritable bowel syndrome)     PCP: Pcp, No  REFERRING PROVIDER: Burnice Logan, PA  REFERRING DIAG: M54.50 (ICD-10-CM) - Low back pain, unspecified  THERAPY DIAG:  No diagnosis found.  ONSET DATE: ***  SUBJECTIVE:                                                                                                                                                                                           SUBJECTIVE STATEMENT: *** PERTINENT HISTORY:  ***  PAIN:  Are you having pain? {yes/no:20286} NPRS scale: ***/10 Pain location: *** Pain orientation: {Pain Orientation:25161}  PAIN TYPE: {type:313116} Pain description: {PAIN DESCRIPTION:21022940}  Aggravating factors: *** Relieving factors: ***  PRECAUTIONS: {Therapy precautions:24002}  WEIGHT BEARING RESTRICTIONS {Yes ***/No:24003}  FALLS:  Has patient fallen in last 6 months? {yes/no:20286}, Number of falls: ***  LIVING ENVIRONMENT: Lives with: {[facility] lives with:25569::"lives with their family"} Lives in: {Lives in:25570} Stairs: {yes/no:20286}; {Stairs:24000} Has following equipment at home: {Assistive  devices:23999}  OCCUPATION: ***  PLOF: {PLOF:24004}  PATIENT GOALS ***   OBJECTIVE:   DIAGNOSTIC FINDINGS:  None current  PATIENT SURVEYS:  {rehab surveys:24030}  SCREENING FOR RED FLAGS: Bowel or bladder incontinence: {Yes/No:304960894} Cauda equina syndrome: {Yes/No:304960894}  COGNITION:  Overall cognitive status: {cognition:24006}     SENSATION:  Light touch: {intact/deficits:24005}    MUSCLE LENGTH: Hamstrings: Right *** deg; Left *** deg Thomas test: Right *** deg; Left *** deg  POSTURE:  ***  PALPATION: ***  LUMBARAROM/PROM  A/PROM A/PROM  10/28/2021  Flexion   Extension   Right lateral flexion   Left lateral flexion   Right rotation   Left rotation    (Blank rows = not tested)  LE AROM/PROM:  A/PROM Right 10/28/2021  Left 10/28/2021  Hip flexion    Hip extension    Hip abduction    Hip adduction    Hip internal rotation    Hip external rotation    Knee flexion    Knee extension    Ankle dorsiflexion    Ankle plantarflexion    Ankle inversion    Ankle eversion     (Blank rows = not tested)  LE MMT:  MMT Right 10/28/2021 Left 10/28/2021  Hip flexion    Hip extension    Hip abduction    Hip adduction    Hip internal rotation    Hip external rotation    Knee flexion    Knee extension    Ankle dorsiflexion    Ankle plantarflexion    Ankle inversion    Ankle eversion     (Blank rows = not tested)  LUMBAR SPECIAL TESTS:  {lumbar special test:25242}  FUNCTIONAL TESTS:  {Functional tests:24029}  GAIT: Distance walked: *** Assistive device utilized: {Assistive devices:23999} Level of assistance: {Levels of assistance:24026} Comments: ***    TODAY'S TREATMENT  [facility] Adult PT Treatment:                                                DATE: 10/31/2021 Therapeutic Exercise: *** Manual Therapy: *** Neuromuscular re-ed: *** Therapeutic Activity: *** Modalities: *** Self Care: ***    PATIENT EDUCATION:  Education  details: *** Person educated: {Person educated:25204} Education method: {Education Method:25205} Education comprehension: {Education Comprehension:25206}   HOME EXERCISE PROGRAM: ***  ASSESSMENT:  CLINICAL IMPRESSION: Patient is a *** y.o. *** who was seen today for physical therapy evaluation and treatment for ***. Objective impairments include {opptimpairments:25111}. These impairments are limiting patient from {activity limitations:25113}. Personal factors including {Personal factors:25162} are also affecting patient's functional outcome. Patient will benefit from skilled PT to address above impairments and improve overall function.  REHAB POTENTIAL: {rehabpotential:25112}  CLINICAL DECISION MAKING: {clinical decision making:25114}  EVALUATION COMPLEXITY: {Evaluation complexity:25115}   GOALS: Goals reviewed with patient? {yes/no:20286}  SHORT TERM GOALS:  STG Name Target Date Goal status  1 *** Baseline:  {follow up:25551} {GOALSTATUS:25110}  2 *** Baseline:  {follow up:25551} {GOALSTATUS:25110}  3 *** Baseline: {follow up:25551} {GOALSTATUS:25110}  4 *** Baseline: {follow up:25551} {GOALSTATUS:25110}  5 *** Baseline: {follow up:25551} {GOALSTATUS:25110}  6 *** Baseline: {follow up:25551} {GOALSTATUS:25110}  7 *** Baseline: {follow up:25551} {GOALSTATUS:25110}   LONG TERM GOALS:   LTG Name Target Date Goal status  1 *** Baseline: {follow up:25551} {GOALSTATUS:25110}  2 *** Baseline: {follow up:25551} {GOALSTATUS:25110}  3 *** Baseline: {follow up:25551} {GOALSTATUS:25110}  4 *** Baseline: {follow up:25551} {GOALSTATUS:25110}  5 *** Baseline: {follow up:25551} {GOALSTATUS:25110}  6 *** Baseline: {follow up:25551} {GOALSTATUS:25110}  7 *** Baseline: {follow up:25551} {GOALSTATUS:25110}   PLAN: PT FREQUENCY: {rehab frequency:25116}  PT DURATION: {rehab duration:25117}  PLANNED INTERVENTIONS: {rehab planned interventions:25118::"Therapeutic  exercises","Therapeutic activity","Neuro Muscular re-education","Balance training","Gait training","Patient/Family education","Joint mobilization"}  PLAN FOR NEXT SESSION: Vanessa Clarence, PT, DPT 10/28/21 10:12 AM

## 2021-10-31 ENCOUNTER — Ambulatory Visit: Payer: Medicaid Other | Attending: Physician Assistant

## 2021-11-22 NOTE — Therapy (Incomplete)
OUTPATIENT PHYSICAL THERAPY THORACOLUMBAR EVALUATION   Patient Name: Alexis Petty MRN: [id number] DOB:[birth year], 21 y.o., female Today's Date: 11/22/2021    Past Medical History:  Diagnosis Date   Abdominal pain    Crohn's colitis ([place])    Diarrhea    Dysmenorrhea    Seen at [facility]   IBS (irritable bowel syndrome)    IBS (irritable bowel syndrome)    Iron deficiency anemia    Thyroid disease    hyperthyroid   Past Surgical History:  Procedure Laterality Date   CHOLECYSTECTOMY     TONSILLECTOMY Bilateral    WISDOM TOOTH EXTRACTION     Patient Active Problem List   Diagnosis Date Noted   Abnormal uterine bleeding (AUB) 12/29/2014   Anemia due to blood loss, acute 10/03/2013   Abnormal uterine bleeding 07/07/2013   Adiposity 10/17/2012   Abdominal pain    Diarrhea    IBS (irritable bowel syndrome)     PCP: Pcp, No  REFERRING PROVIDER: Fredrich Romans, PA  REFERRING DIAG: Low back pain, unspecified  THERAPY DIAG:  No diagnosis found.  ONSET DATE: ***  SUBJECTIVE:                                                                                                                                                                                           SUBJECTIVE STATEMENT: *** PERTINENT HISTORY:  ***  PAIN:  Are you having pain? {yes/no:20286} NPRS scale: ***/10 Pain location: *** Pain orientation: {Pain Orientation:25161}  PAIN TYPE: {type:313116} Pain description: {PAIN DESCRIPTION:21022940}  Aggravating factors: *** Relieving factors: ***  PRECAUTIONS: {Therapy precautions:24002}  WEIGHT BEARING RESTRICTIONS {Yes ***/No:24003}  FALLS:  Has patient fallen in last 6 months? {yes/no:20286}, Number of falls: ***  LIVING ENVIRONMENT: Lives with: {[facility] lives with:25569::"lives with their family"} Lives in: {Lives in:25570} Stairs: {yes/no:20286}; {Stairs:24000} Has following equipment at home: {Assistive devices:23999}  OCCUPATION: ***  PLOF:  {PLOF:24004}  PATIENT GOALS ***   OBJECTIVE:   DIAGNOSTIC FINDINGS:  ***  PATIENT SURVEYS:  {rehab surveys:24030}  SCREENING FOR RED FLAGS: Bowel or bladder incontinence: {Yes/No:304960894} Spinal tumors: {Yes/No:304960894} Cauda equina syndrome: {Yes/No:304960894} Compression fracture: {Yes/No:304960894} Abdominal aneurysm: {Yes/No:304960894}  COGNITION:  Overall cognitive status: {cognition:24006}     SENSATION:  Light touch: {intact/deficits:24005}  Stereognosis: {intact/deficits:24005}  Hot/Cold: {intact/deficits:24005}  Proprioception: {intact/deficits:24005}  MUSCLE LENGTH: Hamstrings: Right *** deg; Left *** deg Thomas test: Right *** deg; Left *** deg  POSTURE:  ***  PALPATION: ***  LUMBARAROM/PROM  A/PROM A/PROM  11/22/2021  Flexion   Extension   Right lateral flexion   Left lateral flexion   Right rotation   Left rotation    (  Blank rows = not tested)  LE AROM/PROM:  A/PROM Right 11/22/2021 Left 11/22/2021  Hip flexion    Hip extension    Hip abduction    Hip adduction    Hip internal rotation    Hip external rotation    Knee flexion    Knee extension    Ankle dorsiflexion    Ankle plantarflexion    Ankle inversion    Ankle eversion     (Blank rows = not tested)  LE MMT:  MMT Right 11/22/2021 Left 11/22/2021  Hip flexion    Hip extension    Hip abduction    Hip adduction    Hip internal rotation    Hip external rotation    Knee flexion    Knee extension    Ankle dorsiflexion    Ankle plantarflexion    Ankle inversion    Ankle eversion     (Blank rows = not tested)  LUMBAR SPECIAL TESTS:  {lumbar special test:25242}  FUNCTIONAL TESTS:  {Functional tests:24029}  GAIT: Distance walked: *** Assistive device utilized: {Assistive devices:23999} Level of assistance: {Levels of assistance:24026} Comments: ***    TODAY'S TREATMENT  ***   PATIENT EDUCATION:  Education details: *** Person educated: {Person  educated:25204} Education method: {Education Method:25205} Education comprehension: {Education Comprehension:25206}   HOME EXERCISE PROGRAM: ***  ASSESSMENT:  CLINICAL IMPRESSION: Patient is a *** y.o. *** who was seen today for physical therapy evaluation and treatment for ***.    OBJECTIVE IMPAIRMENTS {opptimpairments:25111}.   ACTIVITY LIMITATIONS {activity limitations:25113}.   PERSONAL FACTORS {Personal factors:25162} are also affecting patient's functional outcome.    REHAB POTENTIAL: {rehabpotential:25112}  CLINICAL DECISION MAKING: {clinical decision making:25114}  EVALUATION COMPLEXITY: {Evaluation complexity:25115}   GOALS: Goals reviewed with patient? {yes/no:20286}  SHORT TERM GOALS:  STG Name Target Date Goal status  1 *** Baseline:  {follow up:25551} {GOALSTATUS:25110}  2 *** Baseline:  {follow up:25551} {GOALSTATUS:25110}  3 *** Baseline: {follow up:25551} {GOALSTATUS:25110}  4 *** Baseline: {follow up:25551} {GOALSTATUS:25110}  5 *** Baseline: {follow up:25551} {GOALSTATUS:25110}  6 *** Baseline: {follow up:25551} {GOALSTATUS:25110}  7 *** Baseline: {follow up:25551} {GOALSTATUS:25110}   LONG TERM GOALS:   LTG Name Target Date Goal status  1 *** Baseline: {follow up:25551} {GOALSTATUS:25110}  2 *** Baseline: {follow up:25551} {GOALSTATUS:25110}  3 *** Baseline: {follow up:25551} {GOALSTATUS:25110}  4 *** Baseline: {follow up:25551} {GOALSTATUS:25110}  5 *** Baseline: {follow up:25551} {GOALSTATUS:25110}  6 *** Baseline: {follow up:25551} {GOALSTATUS:25110}  7 *** Baseline: {follow up:25551} {GOALSTATUS:25110}   PLAN: PT FREQUENCY: {rehab frequency:25116}  PT DURATION: {rehab duration:25117}  PLANNED INTERVENTIONS: {rehab planned interventions:25118::"Therapeutic exercises","Therapeutic activity","Neuro Muscular re-education","Balance training","Gait training","Patient/Family education","Joint mobilization"}  PLAN FOR NEXT  SESSION: ***   Bari Leib, PT 11/22/2021, 8:35 PM

## 2021-11-23 ENCOUNTER — Ambulatory Visit: Payer: Medicaid Other | Attending: Physician Assistant

## 2021-12-19 ENCOUNTER — Encounter: Payer: Medicaid Other | Admitting: Obstetrics & Gynecology

## 2022-02-27 ENCOUNTER — Encounter: Payer: Medicaid Other | Admitting: Advanced Practice Midwife

## 2022-02-27 NOTE — Progress Notes (Unsigned)
   Subjective:     Alexis Petty is a 21 y.o. female here at [facility] *** for a routine exam.  Current complaints: ***.  Personal health questionnaire reviewed: {yes/no:9010}.  Do you have a primary care provider? *** Do you feel safe at home? ***    Health Maintenance Due  Topic Date Due   COVID-19 Vaccine (1) Never done   HPV VACCINES (1 - 2-dose series) Never done   HIV Screening  Never done   Hepatitis C Screening  Never done   PAP-Cervical Cytology Screening  Never done   PAP SMEAR-Modifier  Never done     Risk factors for chronic health problems: Smoking: Alchohol/how much: Pt BMI: There is no height or weight on file to calculate BMI.   Gynecologic History No LMP recorded. (Menstrual status: Irregular Periods). Contraception: {method:5051} Last Pap: ***. Results were: {norm/abn:16337} Last mammogram: ***. Results were: {norm/abn:16337}  Obstetric History OB History  Gravida Para Term Preterm AB Living  0            SAB IAB Ectopic Multiple Live Births                {Common ambulatory SmartLinks:19316}  Review of Systems {ros; complete:30496}    Objective:   There were no vitals taken for this visit. VS reviewed, nursing note reviewed,  Constitutional: well developed, well nourished, no distress HEENT: normocephalic CV: normal rate Pulm/chest wall: normal effort Breast Exam:  ***Deferred with low risks and shared decision making, discussed recommendation to start mammogram between 40-50 yo/ exam performed: right breast normal without mass, skin or nipple changes or axillary nodes, left breast normal without mass, skin or nipple changes or axillary nodes Abdomen: soft Neuro: alert and oriented x 3 Skin: warm, dry Psych: affect normal Pelvic exam: ***Deferred/ Performed: Cervix pink, visually closed, without lesion, scant white creamy discharge, vaginal walls and external genitalia normal Bimanual exam: Cervix 0/long/high, firm, anterior, neg CMT, uterus  nontender, nonenlarged, adnexa without tenderness, enlargement, or mass       Assessment/Plan:   There are no diagnoses linked to this encounter.     No follow-ups on file.   Sharen Counter, CNM 8:18 AM

## 2022-04-06 ENCOUNTER — Other Ambulatory Visit: Payer: Self-pay

## 2022-04-06 DIAGNOSIS — Z20822 Contact with and (suspected) exposure to covid-19: Secondary | ICD-10-CM | POA: Insufficient documentation

## 2022-04-06 DIAGNOSIS — J020 Streptococcal pharyngitis: Secondary | ICD-10-CM | POA: Diagnosis not present

## 2022-04-06 DIAGNOSIS — J029 Acute pharyngitis, unspecified: Secondary | ICD-10-CM | POA: Diagnosis present

## 2022-04-06 LAB — SARS CORONAVIRUS 2 BY RT PCR: SARS Coronavirus 2 by RT PCR: NEGATIVE

## 2022-04-06 LAB — GROUP A STREP BY PCR: Group A Strep by PCR: DETECTED — AB

## 2022-04-06 NOTE — ED Triage Notes (Signed)
POV, pt sts that shes had fever on and off since Tuesday, multiple complaints since then. Tried tylenol at home with no relief of fever, sore throat that has now gotten better. Redness on face tonight and bilateral ear pain. Alert and oriented x 4, amb.

## 2022-04-07 ENCOUNTER — Emergency Department (HOSPITAL_BASED_OUTPATIENT_CLINIC_OR_DEPARTMENT_OTHER)
Admission: EM | Admit: 2022-04-07 | Discharge: 2022-04-07 | Disposition: A | Discharge status: Home / Self Care | Payer: Medicare Other | Attending: Emergency Medicine | Admitting: Emergency Medicine

## 2022-04-07 DIAGNOSIS — J020 Streptococcal pharyngitis: Secondary | ICD-10-CM

## 2022-04-07 MED ORDER — CEPHALEXIN 250 MG PO CAPS
500.0000 mg | ORAL_CAPSULE | Freq: Once | ORAL | Status: AC
  Administered 2022-04-07: 500 mg via ORAL
  Filled 2022-04-07: qty 2

## 2022-04-07 MED ORDER — CEPHALEXIN 500 MG PO CAPS: 500.0000 mg | ORAL_CAPSULE | Freq: Four times a day (QID) | ORAL | 0 refills | Status: DC

## 2022-04-07 NOTE — ED Provider Notes (Signed)
[facility] EMERGENCY DEPT Provider Note   CSN: [id number] Arrival date & time: 04/06/22  2115     History  Chief Complaint  Patient presents with   Fever    Alexis Petty is a 21 y.o. female.  Patient is a 21 year old female with history of irritable bowel.  Patient presents today with complaints of sore throat and facial swelling.  The sore throat began 1 week ago.  Over the past 2 days, she has noted flushing, swelling, and tenderness to both cheeks, ears, and forehead.  She denies any headaches.  She denies any difficulty breathing or swallowing.  She has had fever at home.  The history is provided by the patient.       Home Medications Prior to Admission medications   Medication Sig Start Date End Date Taking? Authorizing Provider  ALAYCEN 1/35 tablet Take 1 tablet by mouth daily. 08/19/20   [provider]  hydrOXYzine (ATARAX/VISTARIL) 25 MG tablet Take 25 mg by mouth 2 (two) times daily. 04/11/21   [provider]  levothyroxine (SYNTHROID) 50 MCG tablet Take 50 mcg by mouth daily before breakfast. 03/31/21   [provider]  ondansetron (ZOFRAN ODT) 8 MG disintegrating tablet Take 1 tablet (8 mg total) by mouth every 8 (eight) hours as needed for nausea or vomiting. 06/23/21   Lorre Nick, MD  pantoprazole (PROTONIX) 40 MG tablet Take 40 mg by mouth 2 (two) times daily. 08/24/20   [provider]  promethazine (PHENERGAN) 12.5 MG tablet Take 12.5 mg by mouth every 8 (eight) hours as needed for nausea or vomiting. 02/22/21   [provider]  promethazine (PHENERGAN) 25 MG suppository Place 1 suppository (25 mg total) rectally every 6 (six) hours as needed for nausea or vomiting. Patient not taking: Reported on 07/01/2021 04/23/21   Molpus, Jonny Ruiz, MD      Allergies    Cocos nucifera, Ibuprofen, Reglan [metoclopramide], Tylenol [acetaminophen], and Haldol [haloperidol]    Review of Systems   Review of Systems  All  other systems reviewed and are negative.   Physical Exam Updated Vital Signs BP (!) 148/79 (BP Location: Right Arm)   Pulse 84   Temp 99.7 F (37.6 C)   Resp 18   Ht 5\' 3"  (1.6 m)   Wt 107 kg   LMP 03/24/2022   SpO2 100%   BMI 41.81 kg/m  Physical Exam Vitals and nursing note reviewed.  Constitutional:      General: She is not in acute distress.    Appearance: She is well-developed. She is not diaphoretic.  HENT:     Head: Normocephalic and atraumatic.     Mouth/Throat:     Mouth: Mucous membranes are moist.     Pharynx: Posterior oropharyngeal erythema present. No oropharyngeal exudate.  Cardiovascular:     Rate and Rhythm: Normal rate and regular rhythm.     Heart sounds: No murmur heard.    No friction rub. No gallop.  Pulmonary:     Effort: Pulmonary effort is normal. No respiratory distress.     Breath sounds: Normal breath sounds. No wheezing.  Abdominal:     General: Bowel sounds are normal. There is no distension.     Palpations: Abdomen is soft.     Tenderness: There is no abdominal tenderness.  Musculoskeletal:        General: Normal range of motion.     Cervical back: Normal range of motion and neck supple.  Skin:    General: Skin  is warm and dry.     Comments: There is some tenderness, erythema, and warmth to both cheeks and ears.  Neurological:     General: No focal deficit present.     Mental Status: She is alert and oriented to person, place, and time.     ED Results / Procedures / Treatments   Labs (all labs ordered are listed, but only abnormal results are displayed) Labs Reviewed  GROUP A STREP BY PCR - Abnormal; Notable for the following components:      Result Value   Group A Strep by PCR DETECTED (*)    All other components within normal limits  SARS CORONAVIRUS 2 BY RT PCR    EKG None  Radiology No results found.  Procedures Procedures    Medications Ordered in ED Medications  cephALEXin (KEFLEX) capsule 500 mg (has no  administration in time range)    ED Course/ Medical Decision Making/ A&P  Patient presenting with facial swelling and sore throat.  Strep test is positive.  The facial swelling and redness I suspect is scarlatina.  Patient to be treated with Keflex, ibuprofen, and follow-up as needed.  Final Clinical Impression(s) / ED Diagnoses Final diagnoses:  None    Rx / DC Orders ED Discharge Orders     None         Geoffery Lyons, MD 04/07/22 [phone]

## 2022-04-07 NOTE — ED Notes (Signed)
Pt agreeable with d/c plan as discussed by provider- this nurse has verbally reinforced d/c instructions and provided pt with written copy - pt acknowledges verbal understanding and denies any additional questions, concerns, needs- pt ambulatory independently at d/c with steady gait - no distress.

## 2022-04-07 NOTE — Discharge Instructions (Addendum)
Begin taking Keflex as prescribed.  Take ibuprofen 600 mg every 6 hours as needed for pain or fever.  Return to the emergency department for difficulty breathing, difficulty swallowing, or for other new and concerning symptoms.

## 2022-04-16 ENCOUNTER — Encounter: Payer: Medicaid Other | Admitting: Advanced Practice Midwife

## 2022-05-18 ENCOUNTER — Ambulatory Visit: Payer: Medicare Other | Admitting: Podiatry

## 2022-05-30 ENCOUNTER — Ambulatory Visit (INDEPENDENT_AMBULATORY_CARE_PROVIDER_SITE_OTHER): Payer: Medicare Other | Admitting: Podiatry

## 2022-05-30 DIAGNOSIS — L600 Ingrowing nail: Secondary | ICD-10-CM | POA: Diagnosis not present

## 2022-05-30 NOTE — Progress Notes (Signed)
Subjective:  Patient ID: Alexis Petty, female    DOB: May 28, 2001,  MRN: [id number]  Chief Complaint  Patient presents with   Ingrown Toenail    right great toenail ingrown    21 y.o. female presents with the above complaint.  Patient presents with right lateral border ingrown.  Pain on palpation.  Patient would like to have it removed she has not seen anyone else prior to seeing me.  Hurts with ambulation.  She denies any other acute complaints.  Pain scale 7 out of 10.  She has taken antibiotics for infection.   Review of Systems: Negative except as noted in the HPI. Denies N/V/F/Ch.  Past Medical History:  Diagnosis Date   Abdominal pain    Crohn's colitis (HCC)    Diarrhea    Dysmenorrhea    Seen at [facility]   IBS (irritable bowel syndrome)    IBS (irritable bowel syndrome)    Iron deficiency anemia    Thyroid disease    hyperthyroid    Current Outpatient Medications:    ALAYCEN 1/35 tablet, Take 1 tablet by mouth daily., Disp: , Rfl:    cephALEXin (KEFLEX) 500 MG capsule, Take 1 capsule (500 mg total) by mouth 4 (four) times daily., Disp: 28 capsule, Rfl: 0   hydrOXYzine (ATARAX/VISTARIL) 25 MG tablet, Take 25 mg by mouth 2 (two) times daily., Disp: , Rfl:    levothyroxine (SYNTHROID) 50 MCG tablet, Take 50 mcg by mouth daily before breakfast., Disp: , Rfl:    ondansetron (ZOFRAN ODT) 8 MG disintegrating tablet, Take 1 tablet (8 mg total) by mouth every 8 (eight) hours as needed for nausea or vomiting., Disp: 20 tablet, Rfl: 0   pantoprazole (PROTONIX) 40 MG tablet, Take 40 mg by mouth 2 (two) times daily., Disp: , Rfl:    promethazine (PHENERGAN) 12.5 MG tablet, Take 12.5 mg by mouth every 8 (eight) hours as needed for nausea or vomiting., Disp: , Rfl:    promethazine (PHENERGAN) 25 MG suppository, Place 1 suppository (25 mg total) rectally every 6 (six) hours as needed for nausea or vomiting., Disp: 12 each, Rfl: 1  Social History   Tobacco Use  Smoking Status Never   Smokeless Tobacco Never    Allergies  Allergen Reactions   Cocos Nucifera Swelling    Fever, runny nose   Ibuprofen Nausea And Vomiting and Other (See Comments)    Makes Crohn's disease worse    Reglan [Metoclopramide] Other (See Comments)    Per patient: caused agitation; made patient not feel like herself.   Tylenol [Acetaminophen] Nausea And Vomiting and Other (See Comments)    Makes Crohn's disease worse    Haldol [Haloperidol] Anxiety   Objective:  There were no vitals filed for this visit. There is no height or weight on file to calculate BMI. Constitutional Well developed. Well nourished.  Vascular Dorsalis pedis pulses palpable bilaterally. Posterior tibial pulses palpable bilaterally. Capillary refill normal to all digits.  No cyanosis or clubbing noted. Pedal hair growth normal.  Neurologic Normal speech. Oriented to person, place, and time. Epicritic sensation to light touch grossly present bilaterally.  Dermatologic Painful ingrowing nail at lateral nail borders of the hallux nail right. No other open wounds. No skin lesions.  Orthopedic: Normal joint ROM without pain or crepitus bilaterally. No visible deformities. No bony tenderness.   Radiographs: None Assessment:   1. Ingrown toenail of right foot    Plan:  Patient was evaluated and treated and all questions answered.  Ingrown  Nail, right -Patient elects to proceed with minor surgery to remove ingrown toenail removal today. Consent reviewed and signed by patient. -Ingrown nail excised. See procedure note. -Educated on post-procedure care including soaking. Written instructions provided and reviewed. -Patient to follow up in 2 weeks for nail check.  Procedure: Excision of Ingrown Toenail Location: Right 1st toe lateral nail borders. Anesthesia: Lidocaine 1% plain; 1.5 mL and Marcaine 0.5% plain; 1.5 mL, digital block. Skin Prep: Betadine. Dressing: Silvadene; telfa; dry, sterile, compression  dressing. Technique: Following skin prep, the toe was exsanguinated and a tourniquet was secured at the base of the toe. The affected nail border was freed, split with a nail splitter, and excised. Chemical matrixectomy was then performed with phenol and irrigated out with alcohol. The tourniquet was then removed and sterile dressing applied. Disposition: Patient tolerated procedure well. Patient to return in 2 weeks for follow-up.   No follow-ups on file.

## 2022-06-14 DIAGNOSIS — L304 Erythema intertrigo: Secondary | ICD-10-CM | POA: Diagnosis not present

## 2022-06-14 DIAGNOSIS — J020 Streptococcal pharyngitis: Secondary | ICD-10-CM | POA: Diagnosis not present

## 2022-06-25 ENCOUNTER — Other Ambulatory Visit: Payer: Self-pay

## 2022-06-25 ENCOUNTER — Emergency Department (HOSPITAL_COMMUNITY): Payer: Medicare Other

## 2022-06-25 ENCOUNTER — Encounter (HOSPITAL_COMMUNITY): Payer: Self-pay

## 2022-06-25 ENCOUNTER — Emergency Department (HOSPITAL_COMMUNITY)
Admission: EM | Admit: 2022-06-25 | Discharge: 2022-06-25 | Discharge status: Against Medical Advice | Payer: Medicare Other | Attending: Physician Assistant | Admitting: Physician Assistant

## 2022-06-25 DIAGNOSIS — R109 Unspecified abdominal pain: Secondary | ICD-10-CM | POA: Insufficient documentation

## 2022-06-25 DIAGNOSIS — R1110 Vomiting, unspecified: Secondary | ICD-10-CM | POA: Diagnosis not present

## 2022-06-25 DIAGNOSIS — Z5321 Procedure and treatment not carried out due to patient leaving prior to being seen by health care provider: Secondary | ICD-10-CM | POA: Insufficient documentation

## 2022-06-25 DIAGNOSIS — R5381 Other malaise: Secondary | ICD-10-CM | POA: Diagnosis not present

## 2022-06-25 DIAGNOSIS — I10 Essential (primary) hypertension: Secondary | ICD-10-CM | POA: Diagnosis not present

## 2022-06-25 DIAGNOSIS — R197 Diarrhea, unspecified: Secondary | ICD-10-CM | POA: Diagnosis not present

## 2022-06-25 LAB — COMPREHENSIVE METABOLIC PANEL
ALT: 34 U/L (ref 0–44)
AST: 35 U/L (ref 15–41)
Albumin: 3.7 g/dL (ref 3.5–5.0)
Alkaline Phosphatase: 82 U/L (ref 38–126)
Anion gap: 8 (ref 5–15)
BUN: 6 mg/dL (ref 6–20)
CO2: 22 mmol/L (ref 22–32)
Calcium: 9.3 mg/dL (ref 8.9–10.3)
Chloride: 109 mmol/L (ref 98–111)
Creatinine, Ser: 0.85 mg/dL (ref 0.44–1.00)
GFR, Estimated: >60 mL/min (ref >60)
Glucose, Bld: 110 mg/dL — ABNORMAL HIGH (ref 70–99)
Potassium: 4.5 mmol/L (ref 3.5–5.1)
Sodium: 139 mmol/L (ref 135–145)
Total Bilirubin: 0.3 mg/dL (ref 0.3–1.2)
Total Protein: 7.7 g/dL (ref 6.5–8.1)

## 2022-06-25 LAB — URINALYSIS, ROUTINE W REFLEX MICROSCOPIC
Bilirubin Urine: NEGATIVE
Glucose, UA: NEGATIVE mg/dL
Ketones, ur: NEGATIVE mg/dL
Leukocytes,Ua: NEGATIVE
Nitrite: NEGATIVE
Protein, ur: NEGATIVE mg/dL
Specific Gravity, Urine: >1.03 — ABNORMAL HIGH (ref 1.005–1.030)
pH: 5.5 (ref 5.0–8.0)

## 2022-06-25 LAB — CBC WITH DIFFERENTIAL/PLATELET
Abs Immature Granulocytes: 0.09 10*3/uL — ABNORMAL HIGH (ref 0.00–0.07)
Basophils Absolute: 0 10*3/uL (ref 0.0–0.1)
Basophils Relative: 0 %
Eosinophils Absolute: 0.1 10*3/uL (ref 0.0–0.5)
Eosinophils Relative: 0 %
HCT: 39.8 % (ref 36.0–46.0)
Hemoglobin: 11.1 g/dL — ABNORMAL LOW (ref 12.0–15.0)
Immature Granulocytes: 1 %
Lymphocytes Relative: 7 %
Lymphs Abs: 1.2 10*3/uL (ref 0.7–4.0)
MCH: 19.8 pg — ABNORMAL LOW (ref 26.0–34.0)
MCHC: 27.9 g/dL — ABNORMAL LOW (ref 30.0–36.0)
MCV: 71.1 fL — ABNORMAL LOW (ref 80.0–100.0)
Monocytes Absolute: 1 10*3/uL (ref 0.1–1.0)
Monocytes Relative: 6 %
Neutro Abs: 15.1 10*3/uL — ABNORMAL HIGH (ref 1.7–7.7)
Neutrophils Relative %: 86 %
Platelets: 473 10*3/uL — ABNORMAL HIGH (ref 150–400)
RBC: 5.6 MIL/uL — ABNORMAL HIGH (ref 3.87–5.11)
RDW: 19.2 % — ABNORMAL HIGH (ref 11.5–15.5)
WBC: 17.4 10*3/uL — ABNORMAL HIGH (ref 4.0–10.5)
nRBC: 0 % (ref 0.0–0.2)

## 2022-06-25 LAB — I-STAT BETA HCG BLOOD, ED (MC, WL, AP ONLY): I-stat hCG, quantitative: <5 m[IU]/mL (ref <5)

## 2022-06-25 LAB — URINALYSIS, MICROSCOPIC (REFLEX): Bacteria, UA: NONE SEEN

## 2022-06-25 LAB — LIPASE, BLOOD: Lipase: 25 U/L (ref 11–51)

## 2022-06-25 NOTE — ED Notes (Signed)
X2 no response for room assignment 

## 2022-06-25 NOTE — ED Provider Triage Note (Signed)
Emergency Medicine Provider Triage Evaluation Note  Alexis Petty , a 21 y.o. female  was evaluated in triage.  Pt complains of abdominal pain, bloody stools.  History of Crohns.  Not currently followed by GI.  Some NBNB emesis.  Review of Systems  Positive: abd pain, emesis, blood in stool  Negative: fever   Physical Exam  There were no vitals taken for this visit. Gen:   Awake, no distress   Resp:  Normal effort  MSK:   Moves extremities without difficulty  Other:    Medical Decision Making  Medically screening exam initiated at 1:20 PM.  Appropriate orders placed.  Caldonia Demore was informed that the remainder of the evaluation will be completed by another provider, this initial triage assessment does not replace that evaluation, and the importance of remaining in the ED until their evaluation is complete.  Abd pain   Eshika Reckart A, PA-C 06/25/22 1325

## 2022-06-25 NOTE — ED Triage Notes (Signed)
N/v abd pain blood in stool hx of crohns.  Took zofran 30 mins pta.

## 2022-06-27 ENCOUNTER — Ambulatory Visit: Payer: Medicare Other | Admitting: Podiatry

## 2022-06-28 ENCOUNTER — Encounter: Payer: Self-pay | Admitting: Internal Medicine

## 2022-06-28 ENCOUNTER — Ambulatory Visit (INDEPENDENT_AMBULATORY_CARE_PROVIDER_SITE_OTHER): Payer: Medicare Other | Admitting: Internal Medicine

## 2022-06-28 ENCOUNTER — Other Ambulatory Visit: Payer: Self-pay

## 2022-06-28 VITALS — BP 165/94 | HR 81 | Temp 97.9°F | Resp 16 | Ht 63.0 in | Wt 242.2 lb

## 2022-06-28 DIAGNOSIS — D72829 Elevated white blood cell count, unspecified: Secondary | ICD-10-CM | POA: Diagnosis not present

## 2022-06-28 DIAGNOSIS — R21 Rash and other nonspecific skin eruption: Secondary | ICD-10-CM

## 2022-06-28 DIAGNOSIS — J029 Acute pharyngitis, unspecified: Secondary | ICD-10-CM

## 2022-06-28 NOTE — Patient Instructions (Signed)
I suspect your recurrent positive strep throat culture is suggesting a carrier state   Your symptoms previously don't quite suggest strep throat pharyngitis. And today there is no evidence of pharyngitis   With your white blood cell count elevation, "crohn's symptoms" (not sure it is yet), hypothyroidism, vaginal bleeding, malar (cheek/facial) recurrent rash, I suspect that you might have an autoimmune condition.    I suggest getting an internist referral who can start reviewing and following symptoms and get you to the right diagnosis/care   For now, will check blood test today and in 4-6 weeks to ascertain carrier state. No need for antibiotics.

## 2022-06-28 NOTE — Progress Notes (Addendum)
[facility]  Reason for Consult:positive throat group a strep Referring Provider: Sabra Heck, [place]    Patient Active Problem List   Diagnosis Date Noted   Abnormal uterine bleeding (AUB) 12/29/2014   Anemia due to blood loss, acute 10/03/2013   Abnormal uterine bleeding 07/07/2013   Adiposity 10/17/2012   Abdominal pain    Diarrhea    IBS (irritable bowel syndrome)       HPI: Alexis Petty is a 21 y.o. female obesity, s/p tonsillectomy, working dx of Crohn's not currently on medication, hypothyroidism referred here for persistent strep throat positive culture  Patient said she had sorethroat and fever for several days that she couldn't shake end of 03/2022. She went to urgent care and had a positive strep throat cx and given cephalexin for 7 days.  She noted a facial red rash at that time including cheek/forehead associated with headache/tearing of eyes, and sore on her tongues  The sx went away but then keep coming back a few times the last few months, including the sorethroat/facial rash/sores in tongue, no fever (in June >100.5, but not the last few months). She was given amoxicillin 2 more courses by her pcp in July. She returned 9/8 with sorethroat complain and a rapid strep antigen was checked and was positive so referred to ID clinic.  She was also recently seen 9/18 in ed for abd pain/n/v but left ?ama.   Noted to have low-moderate leukocytosis for the past several months with reactive thrombocytosis and low grade anemia. Mcv 71  She also has had recurrent intertrigo which is treated with topical antifungal Previously seen different gi doctors and had endoscopy/mucosal biopsy and was told that she had crohn's (seen for abd pain/diarrhea). Takes sulfasalazine for a while but hasn't been on it as trying to establish new gi doctor. Also has ibs dx on chart. Currently constipated.  Has GERD/reflux Has hypothyroidism  A few years  previously was hospitalized for severe menorrhagia of > 2 months needing blood transfusion.  Has legs joint pain but not ankles/wrists.  No recurrent chest pain  No raynauds, scleroderma like skin, dysphagia, polyarthralgia  No family hx rheumatic heart disease, autoimmune disease, lupus, hematologic disease  No current b sx. Good appetite    Meds: Iron Ketoconazole cream Zofran Levothyroxine Protonix   Review of Systems: ROS All other ros negative   Past Medical History:  Diagnosis Date   Abdominal pain    Crohn's colitis ([place])    Diarrhea    Dysmenorrhea    Seen at [facility]   IBS (irritable bowel syndrome)    IBS (irritable bowel syndrome)    Iron deficiency anemia    Thyroid disease    hyperthyroid    Social History   Tobacco Use   Smoking status: Never   Smokeless tobacco: Never  Vaping Use   Vaping Use: Never used  Substance Use Topics   Alcohol use: No   Drug use: No    Family History  Problem Relation Age of Onset   Heart disease Mother    Diabetes Father    Hypertension Father    Diabetes Maternal Grandmother    Cancer Paternal Grandmother    Cancer Paternal Grandfather     Allergies  Allergen Reactions   Cocos Nucifera Swelling    Fever, runny nose   Ibuprofen Nausea And Vomiting and Other (See Comments)    Makes Crohn's disease worse    Reglan [Metoclopramide] Other (  See Comments)    Per patient: caused agitation; made patient not feel like herself.   Tylenol [Acetaminophen] Nausea And Vomiting and Other (See Comments)    Makes Crohn's disease worse    Haldol [Haloperidol] Anxiety    OBJECTIVE: Vitals:   06/28/22 0906  BP: (!) 165/94  Pulse: 81  Resp: 16  Temp: 97.9 F (36.6 C)  TempSrc: Oral  SpO2: 97%  Weight: 242 lb 3.2 oz (109.9 kg)  Height: 5\' 3"  (1.6 m)   Body mass index is 42.9 kg/m.   Physical Exam General/constitutional: no distress, pleasant HEENT: Normocephalic, PER, Conj Clear, EOMI, Oropharynx clear  (no evidence pharyngitis) Neck supple CV: rrr no mrg Lungs: clear to auscultation, normal respiratory effort Abd: Soft, Nontender Ext: no edema Skin: No Rash Neuro: nonfocal MSK: no peripheral joint swelling/tenderness/warmth; back spines nontender   Lab: Lab Results  Component Value Date   WBC 17.4 (H) 06/25/2022   HGB 11.1 (L) 06/25/2022   HCT 39.8 06/25/2022   MCV 71.1 (L) 06/25/2022   PLT 473 (H) 06/25/2022   Last metabolic panel Lab Results  Component Value Date   GLUCOSE 110 (H) 06/25/2022   NA 139 06/25/2022   K 4.5 06/25/2022   CL 109 06/25/2022   CO2 22 06/25/2022   BUN 6 06/25/2022   CREATININE 0.85 06/25/2022   GFRNONAA >60 06/25/2022   CALCIUM 9.3 06/25/2022   PROT 7.7 06/25/2022   ALBUMIN 3.7 06/25/2022   BILITOT 0.3 06/25/2022   ALKPHOS 82 06/25/2022   AST 35 06/25/2022   ALT 34 06/25/2022   ANIONGAP 8 06/25/2022    Microbiology:  Serology:  Imaging:   Assessment/plan: Problem List Items Addressed This Visit   None Visit Diagnoses     Leukocytosis, unspecified type    -  Primary   Relevant Orders   CBC   Antistreptolysin O titer   Culture, Group A Strep   COMPLETE METABOLIC PANEL WITH GFR   C-reactive protein   Antinuclear Antib (ANA)   Anti-DNA antibody, double-stranded   HIV antibody (with reflex)   Lupus Anticoagulant Eval w/Reflex   Sorethroat       Relevant Orders   Lupus Anticoagulant Eval w/Reflex   Malar rash             We have a young pleasant female of reproductive age who has persistent strep group a throat culture with a constellation of symptoms that don't quite fit with group a pharyngitis.   She certainly could be a carrier.   I thought about the rash, but I do not suspect her to have acute rheumatic fever recently. She has no family hx of rheumatic heart disease  We can check aso titer and trend it and that could perhaps gives more confindence on certainty of carrier state   Now with her still in  diagnostic mode IBD, malar/facial rash/sores on tongue/throat pain that comes and go, menorrhagia history I suspect this could be a systemic presentation of lupus.   The intertrigo could have been due to antibiotics use and her being overweight. I do not think it is a late manifestation of chronic mucocutaneous candidiasis. Although her hypothyroidism might suggest polyendocrinopathies associated.   I think she could benefit from seeing an internist to follow these sx and appropriately diagnose/treat for any autoimmune/connective tissue process  She said she'll see a new GI doctor in October 2023 and that would be also a good thing to sort out definitively what the IBD/IBS sx is all  about  Her elevated wbc/platelet of at least several weeks suggest some inflammatory process but not infectious in nature; consideration mentions to above lupus/connective process.    Labs today hiv, lupus screen, aso -- will repeat evaluation in around 6 weeks and repeat aso then   I have spent a total of 65 minutes of face-to-face and non-face-to-face time, excluding clinical staff time, preparing to see patient, ordering tests and/or medications, and provide counseling the patient     Follow-up: Return in about 6 weeks (around 08/09/2022).  Raymondo Band, MD [facility] 06/28/2022, 9:26 AM

## 2022-07-05 LAB — ANA: Anti Nuclear Antibody (ANA): NEGATIVE

## 2022-07-05 LAB — COMPLETE METABOLIC PANEL WITH GFR
AG Ratio: 1.4 (calc) (ref 1.0–2.5)
ALT: 26 U/L (ref 6–29)
AST: 20 U/L (ref 10–30)
Albumin: 4.1 g/dL (ref 3.6–5.1)
Alkaline phosphatase (APISO): 87 U/L (ref 31–125)
BUN: 8 mg/dL (ref 7–25)
CO2: 25 mmol/L (ref 20–32)
Calcium: 9.1 mg/dL (ref 8.6–10.2)
Chloride: 105 mmol/L (ref 98–110)
Creat: 0.79 mg/dL (ref 0.50–0.96)
Globulin: 3 g/dL (calc) (ref 1.9–3.7)
Glucose, Bld: 88 mg/dL (ref 65–99)
Potassium: 4 mmol/L (ref 3.5–5.3)
Sodium: 140 mmol/L (ref 135–146)
Total Bilirubin: 0.2 mg/dL (ref 0.2–1.2)
Total Protein: 7.1 g/dL (ref 6.1–8.1)
eGFR: 109 mL/min/{1.73_m2} (ref >=60)

## 2022-07-05 LAB — C-REACTIVE PROTEIN: CRP: 13.7 mg/L — ABNORMAL HIGH (ref <8.0)

## 2022-07-05 LAB — CULTURE, GROUP A STREP
MICRO NUMBER:: 13955364
SPECIMEN QUALITY:: ADEQUATE

## 2022-07-05 LAB — CBC
HCT: 32.6 % — ABNORMAL LOW (ref 35.0–45.0)
Hemoglobin: 9.7 g/dL — ABNORMAL LOW (ref 11.7–15.5)
MCH: 20.1 pg — ABNORMAL LOW (ref 27.0–33.0)
MCHC: 29.8 g/dL — ABNORMAL LOW (ref 32.0–36.0)
MCV: 67.5 fL — ABNORMAL LOW (ref 80.0–100.0)
MPV: 9.9 fL (ref 7.5–12.5)
Platelets: 377 10*3/uL (ref 140–400)
RBC: 4.83 10*6/uL (ref 3.80–5.10)
RDW: 17.2 % — ABNORMAL HIGH (ref 11.0–15.0)
WBC: 6.1 10*3/uL (ref 3.8–10.8)

## 2022-07-05 LAB — ANTISTREPTOLYSIN O TITER: ASO: 230 IU/mL — ABNORMAL HIGH (ref <200)

## 2022-07-05 LAB — LUPUS ANTICOAGULANT EVAL W/ REFLEX
PTT-LA Screen: 32 s (ref <=40)
dRVVT: 42 s (ref <=45)

## 2022-07-05 LAB — HIV ANTIBODY (ROUTINE TESTING W REFLEX): HIV 1&2 Ab, 4th Generation: NONREACTIVE

## 2022-07-05 LAB — ANTI-DNA ANTIBODY, DOUBLE-STRANDED: ds DNA Ab: 1 IU/mL

## 2022-07-10 DIAGNOSIS — K5010 Crohn's disease of large intestine without complications: Secondary | ICD-10-CM | POA: Diagnosis not present

## 2022-07-10 DIAGNOSIS — D500 Iron deficiency anemia secondary to blood loss (chronic): Secondary | ICD-10-CM | POA: Diagnosis not present

## 2022-07-10 DIAGNOSIS — E039 Hypothyroidism, unspecified: Secondary | ICD-10-CM | POA: Diagnosis not present

## 2022-07-10 DIAGNOSIS — E162 Hypoglycemia, unspecified: Secondary | ICD-10-CM | POA: Diagnosis not present

## 2022-07-10 DIAGNOSIS — K219 Gastro-esophageal reflux disease without esophagitis: Secondary | ICD-10-CM | POA: Diagnosis not present

## 2022-07-10 DIAGNOSIS — K589 Irritable bowel syndrome without diarrhea: Secondary | ICD-10-CM | POA: Diagnosis not present

## 2022-07-12 ENCOUNTER — Telehealth: Payer: Self-pay | Admitting: Oncology

## 2022-07-12 NOTE — Telephone Encounter (Signed)
Scheduled appt per 10/5 referral. Pt is aware of appt date and time. Pt is aware to arrive 15 mins prior to appt time and to bring and updated insurance card. Pt is aware of appt location.   

## 2022-07-26 ENCOUNTER — Ambulatory Visit: Payer: Medicare Other | Admitting: Internal Medicine

## 2022-07-27 ENCOUNTER — Ambulatory Visit: Payer: Medicare Other | Admitting: Podiatry

## 2022-07-31 ENCOUNTER — Telehealth: Payer: Self-pay | Admitting: Oncology

## 2022-07-31 ENCOUNTER — Inpatient Hospital Stay: Payer: Medicare Other | Admitting: Oncology

## 2022-07-31 NOTE — Telephone Encounter (Signed)
R/s pt's new hem appt. Pt is aware of new appt date/time.  

## 2022-08-10 DIAGNOSIS — G8929 Other chronic pain: Secondary | ICD-10-CM | POA: Diagnosis not present

## 2022-08-15 ENCOUNTER — Ambulatory Visit: Payer: Medicare Other | Admitting: Internal Medicine

## 2022-08-16 ENCOUNTER — Other Ambulatory Visit: Payer: Self-pay | Admitting: Emergency Medicine

## 2022-08-16 ENCOUNTER — Inpatient Hospital Stay: Payer: Medicare Other | Attending: Oncology | Admitting: Oncology

## 2022-08-16 ENCOUNTER — Other Ambulatory Visit: Payer: Self-pay

## 2022-08-16 ENCOUNTER — Inpatient Hospital Stay: Payer: Medicare Other

## 2022-08-16 VITALS — BP 140/90 | HR 80 | Temp 97.6°F | Resp 16 | Wt 248.8 lb

## 2022-08-16 DIAGNOSIS — D500 Iron deficiency anemia secondary to blood loss (chronic): Secondary | ICD-10-CM

## 2022-08-16 DIAGNOSIS — Z79899 Other long term (current) drug therapy: Secondary | ICD-10-CM | POA: Insufficient documentation

## 2022-08-16 DIAGNOSIS — K5090 Crohn's disease, unspecified, without complications: Secondary | ICD-10-CM | POA: Insufficient documentation

## 2022-08-16 DIAGNOSIS — D509 Iron deficiency anemia, unspecified: Secondary | ICD-10-CM | POA: Insufficient documentation

## 2022-08-16 LAB — CBC WITH DIFFERENTIAL/PLATELET
Abs Immature Granulocytes: 0.01 10*3/uL (ref 0.00–0.07)
Basophils Absolute: 0.1 10*3/uL (ref 0.0–0.1)
Basophils Relative: 1 %
Eosinophils Absolute: 0.2 10*3/uL (ref 0.0–0.5)
Eosinophils Relative: 4 %
HCT: 34.7 % — ABNORMAL LOW (ref 36.0–46.0)
Hemoglobin: 10.3 g/dL — ABNORMAL LOW (ref 12.0–15.0)
Immature Granulocytes: 0 %
Lymphocytes Relative: 48 %
Lymphs Abs: 2.6 10*3/uL (ref 0.7–4.0)
MCH: 20.4 pg — ABNORMAL LOW (ref 26.0–34.0)
MCHC: 29.7 g/dL — ABNORMAL LOW (ref 30.0–36.0)
MCV: 68.7 fL — ABNORMAL LOW (ref 80.0–100.0)
Monocytes Absolute: 0.4 10*3/uL (ref 0.1–1.0)
Monocytes Relative: 8 %
Neutro Abs: 2 10*3/uL (ref 1.7–7.7)
Neutrophils Relative %: 39 %
Platelets: 383 10*3/uL (ref 150–400)
RBC: 5.05 MIL/uL (ref 3.87–5.11)
RDW: 18.2 % — ABNORMAL HIGH (ref 11.5–15.5)
WBC: 5.2 10*3/uL (ref 4.0–10.5)
nRBC: 0 % (ref 0.0–0.2)

## 2022-08-16 LAB — IRON AND IRON BINDING CAPACITY (CC-WL,HP ONLY)
Iron: 37 ug/dL (ref 28–170)
Saturation Ratios: 6 % — ABNORMAL LOW (ref 10.4–31.8)
TIBC: 654 ug/dL — ABNORMAL HIGH (ref 250–450)
UIBC: 617 ug/dL — ABNORMAL HIGH (ref 148–442)

## 2022-08-16 LAB — FERRITIN: Ferritin: 3 ng/mL — ABNORMAL LOW (ref 11–307)

## 2022-08-16 NOTE — Progress Notes (Signed)
Reason for the request:    Iron deficiency anemia  HPI: I was asked by Alexis Hammed, PA-C to evaluate Alexis Petty with evaluation of iron deficiency anemia.  She is a 21 year old woman with history of Crohn's disease as well as hypothyroidism who was found to have anemia in October 2023.  Her hemoglobin was 9.4 with MCV of 64.8.  He had normal parameters and indices otherwise.  Her white cell count and platelet count within normal range.  On June 28, 2022 her hemoglobin was 9.7 with MCV of 67.5 and elevated RDW.  She is prescribed oral iron therapy although she has been erratic in taking it and has been intolerant mostly.  She reports dyspepsia and constipation.  She has reported fatigue and tiredness but no shortness of breath or difficulty breathing.  She has reported heavy menstrual bleeding for many years and currently is under reasonable control.  She does have regular menstrual cycle lasting 3 to 5 days.    She does not report any headaches, blurry vision, syncope or seizures. Does not report any fevers, chills or sweats.  Does not report any cough, wheezing or hemoptysis.  Does not report any chest pain, palpitation, orthopnea or leg edema.  Does not report any nausea, vomiting or abdominal pain.  Does not report any constipation or diarrhea.  Does not report any skeletal complaints.    Does not report frequency, urgency or hematuria.  Does not report any skin rashes or lesions. Does not report any heat or cold intolerance.  Does not report any lymphadenopathy or petechiae.  Does not report any anxiety or depression.  Remaining review of systems is negative.     Past Medical History:  Diagnosis Date   Abdominal pain    Crohn's colitis ([place])    Diarrhea    Dysmenorrhea    Seen at [facility]   IBS (irritable bowel syndrome)    IBS (irritable bowel syndrome)    Iron deficiency anemia    Thyroid disease    hyperthyroid  :   Past Surgical History:  Procedure Laterality Date    CHOLECYSTECTOMY     TONSILLECTOMY Bilateral    WISDOM TOOTH EXTRACTION    :   Current Outpatient Medications:    ALAYCEN 1/35 tablet, Take 1 tablet by mouth daily., Disp: , Rfl:    cephALEXin (KEFLEX) 500 MG capsule, Take 1 capsule (500 mg total) by mouth 4 (four) times daily. (Patient not taking: Reported on 06/28/2022), Disp: 28 capsule, Rfl: 0   hydrOXYzine (ATARAX/VISTARIL) 25 MG tablet, Take 25 mg by mouth 2 (two) times daily. (Patient not taking: Reported on 06/28/2022), Disp: , Rfl:    ketoconazole (NIZORAL) 2 % cream, Apply topically., Disp: , Rfl:    levothyroxine (SYNTHROID) 50 MCG tablet, Take 50 mcg by mouth daily before breakfast., Disp: , Rfl:    ondansetron (ZOFRAN ODT) 8 MG disintegrating tablet, Take 1 tablet (8 mg total) by mouth every 8 (eight) hours as needed for nausea or vomiting., Disp: 20 tablet, Rfl: 0   pantoprazole (PROTONIX) 40 MG tablet, Take 40 mg by mouth 2 (two) times daily., Disp: , Rfl:    promethazine (PHENERGAN) 12.5 MG tablet, Take 12.5 mg by mouth every 8 (eight) hours as needed for nausea or vomiting., Disp: , Rfl:    promethazine (PHENERGAN) 25 MG suppository, Place 1 suppository (25 mg total) rectally every 6 (six) hours as needed for nausea or vomiting., Disp: 12 each, Rfl: 1:   Allergies  Allergen Reactions  Cocos Nucifera Swelling    Fever, runny nose   Ibuprofen Nausea And Vomiting and Other (See Comments)    Makes Crohn's disease worse    Reglan [Metoclopramide] Other (See Comments)    Per patient: caused agitation; made patient not feel like herself.   Tylenol [Acetaminophen] Nausea And Vomiting and Other (See Comments)    Makes Crohn's disease worse    Haldol [Haloperidol] Anxiety  :   Family History  Problem Relation Age of Onset   Heart disease Mother    Diabetes Father    Hypertension Father    Diabetes Maternal Grandmother    Cancer Paternal Grandmother    Cancer Paternal Grandfather   :   Social History   Socioeconomic  History   Marital status: Single    Spouse name: Not on file   Number of children: Not on file   Years of education: Not on file   Highest education level: Not on file  Occupational History   Not on file  Tobacco Use   Smoking status: Never   Smokeless tobacco: Never  Vaping Use   Vaping Use: Never used  Substance and Sexual Activity   Alcohol use: No   Drug use: No   Sexual activity: Never    Birth control/protection: Abstinence  Other Topics Concern   Not on file  Social History Narrative   Not on file   Social Determinants of Health   Financial Resource Strain: Not on file  Food Insecurity: Not on file  Transportation Needs: Not on file  Physical Activity: Not on file  Stress: Not on file  Social Connections: Not on file  Intimate Partner Violence: Not on file  :  Pertinent items are noted in HPI.  Exam: Blood pressure (!) 140/90, pulse 80, temperature 97.6 F (36.4 C), temperature source Temporal, resp. rate 16, weight 248 lb 12.8 oz (112.9 kg), SpO2 99 %. ECOG 1 General appearance: alert and cooperative appeared without distress. Head: atraumatic without any abnormalities. Eyes: conjunctivae/corneas clear. PERRL.  Sclera anicteric. Throat: lips, mucosa, and tongue normal; without oral thrush or ulcers. Resp: clear to auscultation bilaterally without rhonchi, wheezes or dullness to percussion. Cardio: regular rate and rhythm, S1, S2 normal, no murmur, click, rub or gallop GI: soft, non-tender; bowel sounds normal; no masses,  no organomegaly Skin: Skin color, texture, turgor normal. No rashes or lesions Lymph nodes: Cervical, supraclavicular, and axillary nodes normal. Neurologic: Grossly normal without any motor, sensory or deep tendon reflexes. Musculoskeletal: No joint deformity or effusion.    Assessment and Plan:   21 year old with:  1.  Microcytic anemia related to iron deficiency from chronic menstrual blood losses in addition to possible GI blood  losses from Crohn's disease.  She has been intolerant to oral iron replacement.  Treatment options moving forward were discussed at this time.  Oral iron therapy versus intravenous iron were discussed at this time.  Complication associated with IV iron infusion that include arthralgias, myalgias and rarely anaphylaxis.  After discussion today, she is agreeable to proceed and she will receive a total of 1000 mg of Venofer.   2.  Follow-up: In the next 4 months for a follow-up.  45  minutes were dedicated to this visit. The time was spent on reviewing laboratory data, discussing treatment options, discussing differential diagnosis and answering questions regarding future plan.   A copy of this consult has been forwarded to the requesting physician.

## 2022-08-21 ENCOUNTER — Inpatient Hospital Stay: Payer: Medicare Other

## 2022-08-24 ENCOUNTER — Other Ambulatory Visit: Payer: Self-pay

## 2022-08-24 ENCOUNTER — Inpatient Hospital Stay: Payer: Medicare Other

## 2022-08-24 VITALS — BP 134/89 | HR 62 | Temp 98.2°F | Resp 16

## 2022-08-24 DIAGNOSIS — Z79899 Other long term (current) drug therapy: Secondary | ICD-10-CM | POA: Diagnosis not present

## 2022-08-24 DIAGNOSIS — D500 Iron deficiency anemia secondary to blood loss (chronic): Secondary | ICD-10-CM

## 2022-08-24 DIAGNOSIS — D509 Iron deficiency anemia, unspecified: Secondary | ICD-10-CM | POA: Diagnosis not present

## 2022-08-24 DIAGNOSIS — K5090 Crohn's disease, unspecified, without complications: Secondary | ICD-10-CM | POA: Diagnosis not present

## 2022-08-24 MED ORDER — SODIUM CHLORIDE 0.9 % IV SOLN: Freq: Once | INTRAVENOUS | Status: AC

## 2022-08-24 MED ORDER — SODIUM CHLORIDE 0.9 % IV SOLN
200.0000 mg | Freq: Once | INTRAVENOUS | Status: AC
  Administered 2022-08-24: 200 mg via INTRAVENOUS
  Filled 2022-08-24: qty 200

## 2022-08-24 NOTE — Patient Instructions (Signed)

## 2022-08-27 ENCOUNTER — Other Ambulatory Visit: Payer: Self-pay

## 2022-08-27 ENCOUNTER — Inpatient Hospital Stay: Payer: Medicare Other

## 2022-08-27 VITALS — BP 122/85 | HR 68 | Temp 98.7°F | Resp 16

## 2022-08-27 DIAGNOSIS — Z79899 Other long term (current) drug therapy: Secondary | ICD-10-CM | POA: Diagnosis not present

## 2022-08-27 DIAGNOSIS — K5090 Crohn's disease, unspecified, without complications: Secondary | ICD-10-CM | POA: Diagnosis not present

## 2022-08-27 DIAGNOSIS — D500 Iron deficiency anemia secondary to blood loss (chronic): Secondary | ICD-10-CM

## 2022-08-27 DIAGNOSIS — D509 Iron deficiency anemia, unspecified: Secondary | ICD-10-CM | POA: Diagnosis not present

## 2022-08-27 MED ORDER — SODIUM CHLORIDE 0.9 % IV SOLN: Freq: Once | INTRAVENOUS | Status: AC

## 2022-08-27 MED ORDER — DIPHENHYDRAMINE HCL 25 MG PO CAPS
25.0000 mg | ORAL_CAPSULE | ORAL | Status: AC
  Administered 2022-08-27: 25 mg via ORAL
  Filled 2022-08-27: qty 1

## 2022-08-27 MED ORDER — ACETAMINOPHEN 325 MG PO TABS
650.0000 mg | ORAL_TABLET | ORAL | Status: AC
  Administered 2022-08-27: 650 mg via ORAL
  Filled 2022-08-27: qty 2

## 2022-08-27 MED ORDER — SODIUM CHLORIDE 0.9 % IV SOLN
200.0000 mg | Freq: Once | INTRAVENOUS | Status: AC
  Administered 2022-08-27: 200 mg via INTRAVENOUS
  Filled 2022-08-27: qty 200

## 2022-08-27 NOTE — Progress Notes (Signed)
Patient tolerated her treatment well- no side effects seen. VSS- BP 122/85 (BP Location: Left Arm, Patient Position: Sitting)   Pulse 68   Temp 98.7 F (37.1 C) (Oral)   Resp 16   SpO2 100%  Patient declined to wait her 30 minute wait time after education. Patient was with her mom and knows to go to the ER if any issues.

## 2022-08-27 NOTE — Patient Instructions (Signed)

## 2022-08-27 NOTE — Progress Notes (Signed)
Patient had body aches and a fever about 4 hours after her last treatment (which was, also, her first treatment). Dr. Athena Masse was not available, so Dr. Arbutus Ped was contacted and gave orders for PO benadryl 25mg  and 650mg  of PO tylenol prior to today's treatment. Patient informed and was satisfied with continuing with today's treatment plan.

## 2022-08-31 ENCOUNTER — Other Ambulatory Visit: Payer: Self-pay

## 2022-08-31 ENCOUNTER — Inpatient Hospital Stay: Payer: Medicare Other

## 2022-08-31 VITALS — BP 135/80 | HR 62 | Temp 98.1°F | Resp 16

## 2022-08-31 DIAGNOSIS — D500 Iron deficiency anemia secondary to blood loss (chronic): Secondary | ICD-10-CM

## 2022-08-31 DIAGNOSIS — K5090 Crohn's disease, unspecified, without complications: Secondary | ICD-10-CM | POA: Diagnosis not present

## 2022-08-31 DIAGNOSIS — D509 Iron deficiency anemia, unspecified: Secondary | ICD-10-CM | POA: Diagnosis not present

## 2022-08-31 DIAGNOSIS — Z79899 Other long term (current) drug therapy: Secondary | ICD-10-CM | POA: Diagnosis not present

## 2022-08-31 MED ORDER — DIPHENHYDRAMINE HCL 25 MG PO CAPS
25.0000 mg | ORAL_CAPSULE | Freq: Once | ORAL | Status: AC
  Administered 2022-08-31: 25 mg via ORAL
  Filled 2022-08-31: qty 1

## 2022-08-31 MED ORDER — ACETAMINOPHEN 325 MG PO TABS
650.0000 mg | ORAL_TABLET | Freq: Once | ORAL | Status: AC
  Administered 2022-08-31: 650 mg via ORAL
  Filled 2022-08-31: qty 2

## 2022-08-31 MED ORDER — SODIUM CHLORIDE 0.9 % IV SOLN: Freq: Once | INTRAVENOUS | Status: AC

## 2022-08-31 MED ORDER — SODIUM CHLORIDE 0.9 % IV SOLN
200.0000 mg | Freq: Once | INTRAVENOUS | Status: AC
  Administered 2022-08-31: 200 mg via INTRAVENOUS
  Filled 2022-08-31: qty 200

## 2022-08-31 NOTE — Patient Instructions (Signed)

## 2022-08-31 NOTE — Progress Notes (Signed)
Pt tolerated IV iron infusion well. She is able to tolerate tylenol premed before infusions. VSS, ambulatory to the lobby.

## 2022-09-03 ENCOUNTER — Ambulatory Visit: Payer: Medicare Other

## 2022-09-03 ENCOUNTER — Inpatient Hospital Stay: Payer: Medicare Other

## 2022-09-03 VITALS — BP 129/56 | HR 76 | Temp 99.3°F | Resp 17

## 2022-09-03 DIAGNOSIS — D509 Iron deficiency anemia, unspecified: Secondary | ICD-10-CM | POA: Diagnosis not present

## 2022-09-03 DIAGNOSIS — K5090 Crohn's disease, unspecified, without complications: Secondary | ICD-10-CM | POA: Diagnosis not present

## 2022-09-03 DIAGNOSIS — D500 Iron deficiency anemia secondary to blood loss (chronic): Secondary | ICD-10-CM

## 2022-09-03 DIAGNOSIS — Z79899 Other long term (current) drug therapy: Secondary | ICD-10-CM | POA: Diagnosis not present

## 2022-09-03 MED ORDER — SODIUM CHLORIDE 0.9 % IV SOLN: Freq: Once | INTRAVENOUS | Status: AC

## 2022-09-03 MED ORDER — DIPHENHYDRAMINE HCL 25 MG PO CAPS
25.0000 mg | ORAL_CAPSULE | Freq: Once | ORAL | Status: AC
  Administered 2022-09-03: 25 mg via ORAL
  Filled 2022-09-03: qty 1

## 2022-09-03 MED ORDER — SODIUM CHLORIDE 0.9 % IV SOLN
200.0000 mg | Freq: Once | INTRAVENOUS | Status: AC
  Administered 2022-09-03: 200 mg via INTRAVENOUS
  Filled 2022-09-03: qty 200

## 2022-09-03 MED ORDER — ACETAMINOPHEN 325 MG PO TABS: 650.0000 mg | ORAL_TABLET | Freq: Once | ORAL | Status: DC

## 2022-09-03 NOTE — Patient Instructions (Signed)

## 2022-09-07 ENCOUNTER — Inpatient Hospital Stay: Payer: Medicare Other | Attending: Oncology

## 2022-09-07 ENCOUNTER — Other Ambulatory Visit: Payer: Self-pay

## 2022-09-07 VITALS — BP 145/87 | HR 74 | Temp 98.4°F | Resp 16

## 2022-09-07 DIAGNOSIS — D509 Iron deficiency anemia, unspecified: Secondary | ICD-10-CM | POA: Insufficient documentation

## 2022-09-07 DIAGNOSIS — D500 Iron deficiency anemia secondary to blood loss (chronic): Secondary | ICD-10-CM

## 2022-09-07 MED ORDER — SODIUM CHLORIDE 0.9 % IV SOLN: Freq: Once | INTRAVENOUS | Status: AC

## 2022-09-07 MED ORDER — SODIUM CHLORIDE 0.9 % IV SOLN
200.0000 mg | Freq: Once | INTRAVENOUS | Status: AC
  Administered 2022-09-07: 200 mg via INTRAVENOUS
  Filled 2022-09-07: qty 200

## 2022-09-07 NOTE — Patient Instructions (Signed)

## 2022-09-07 NOTE — Progress Notes (Signed)
Patient took her benadryl at home about 1415 and declines the acetaminophen due to issues with constipation.  Patient came in with her mother- did not want to stay the 30 minute observation because of previous issues she did stay for about 15 minutes past her infusion. No s/s of any reaction. VSS- BP (!) 145/87 (BP Location: Right Arm, Patient Position: Sitting)   Pulse 74   Temp 98.4 F (36.9 C) (Oral)   Resp 16   SpO2 100%  Patient/mother verbalized that if she has any issues to take her to the ED.

## 2022-10-11 DIAGNOSIS — K5010 Crohn's disease of large intestine without complications: Secondary | ICD-10-CM | POA: Diagnosis not present

## 2022-10-11 DIAGNOSIS — Z03818 Encounter for observation for suspected exposure to other biological agents ruled out: Secondary | ICD-10-CM | POA: Diagnosis not present

## 2022-10-11 DIAGNOSIS — J069 Acute upper respiratory infection, unspecified: Secondary | ICD-10-CM | POA: Diagnosis not present

## 2022-10-11 DIAGNOSIS — E039 Hypothyroidism, unspecified: Secondary | ICD-10-CM | POA: Diagnosis not present

## 2022-11-13 ENCOUNTER — Encounter: Payer: Self-pay | Admitting: Oncology

## 2022-11-23 ENCOUNTER — Other Ambulatory Visit: Payer: Self-pay

## 2022-11-23 DIAGNOSIS — D500 Iron deficiency anemia secondary to blood loss (chronic): Secondary | ICD-10-CM

## 2022-12-04 ENCOUNTER — Telehealth: Payer: Self-pay | Admitting: Physician Assistant

## 2022-12-04 NOTE — Telephone Encounter (Signed)
Rescheduled 03/06 appointment to 03/12 due to provider on-call. Patient is notified.

## 2022-12-11 ENCOUNTER — Encounter: Payer: Self-pay | Admitting: Oncology

## 2022-12-13 ENCOUNTER — Other Ambulatory Visit: Payer: Medicare Other

## 2022-12-13 ENCOUNTER — Ambulatory Visit: Payer: Medicare Other | Admitting: Physician Assistant

## 2022-12-14 NOTE — Progress Notes (Deleted)
[facility] OFFICE PROGRESS NOTE  [place], [place], [place] [address] [place] [address]  DIAGNOSIS: Microcytic anemia related to iron deficiency from chronic menstrual blood losses in addition to possible GI blood losses from Crohn's disease.  She has been intolerant to oral iron replacement.   PRIOR THERAPY: None  CURRENT THERAPY: IV iron with venofer 200 mg PRN, last dose on 09/07/22  INTERVAL HISTORY: Alexis Petty 22 y.o. female returns to clinic today for follow-up visit.  The patient last saw Dr. Alen Blew on 08/16/2022.  She is followed by our clinic for iron deficiency anemia secondary to Crohn's disease and menstrual blood loss.  She receives IV iron as needed her most recent being on 09/07/2022.  She has intolerance to oral iron supplements.  Overall the patient is feeling***today.  Fatigue?  Lightheadedness?  Shortness of breath?  Abnormal bleeding or bruising?  Menstrual cycle still lasting 3 to 5 days.  She is here today for evaluation and repeat blood work  MEDICAL HISTORY: Past Medical History:  Diagnosis Date   Abdominal pain    Crohn's colitis ([place])    Diarrhea    Dysmenorrhea    Seen at [facility]   IBS (irritable bowel syndrome)    IBS (irritable bowel syndrome)    Iron deficiency anemia    Thyroid disease    hyperthyroid    ALLERGIES:  is allergic to cocos nucifera, ibuprofen, reglan [metoclopramide], tylenol [acetaminophen], and haldol [haloperidol].  MEDICATIONS:  Current Outpatient Medications  Medication Sig Dispense Refill   ALAYCEN 1/35 tablet Take 1 tablet by mouth daily.     cephALEXin (KEFLEX) 500 MG capsule Take 1 capsule (500 mg total) by mouth 4 (four) times daily. 28 capsule 0   gabapentin (NEURONTIN) 300 MG capsule Take 1 capsule by mouth for 3 days, THEN 1 capsule by mouth twice a daily for 3 days, THEN 1 capsule by mouth 3 times a day.     hydrOXYzine (ATARAX/VISTARIL) 25 MG tablet Take 25 mg by mouth 2 (two) times  daily.     ketoconazole (NIZORAL) 2 % cream Apply topically.     levothyroxine (SYNTHROID) 50 MCG tablet Take 75 mcg by mouth daily before breakfast. (Patient not taking: Reported on 08/24/2022)     levothyroxine (SYNTHROID) 75 MCG tablet Take by mouth.     ondansetron (ZOFRAN ODT) 8 MG disintegrating tablet Take 1 tablet (8 mg total) by mouth every 8 (eight) hours as needed for nausea or vomiting. 20 tablet 0   pantoprazole (PROTONIX) 40 MG tablet Take 40 mg by mouth 2 (two) times daily.     promethazine (PHENERGAN) 12.5 MG tablet Take 12.5 mg by mouth every 8 (eight) hours as needed for nausea or vomiting.     promethazine (PHENERGAN) 25 MG suppository Place 1 suppository (25 mg total) rectally every 6 (six) hours as needed for nausea or vomiting. (Patient not taking: Reported on 08/16/2022) 12 each 1   triamcinolone cream (KENALOG) 0.1 % Apply topically.     No current facility-administered medications for this visit.    SURGICAL HISTORY:  Past Surgical History:  Procedure Laterality Date   CHOLECYSTECTOMY     TONSILLECTOMY Bilateral    WISDOM TOOTH EXTRACTION      REVIEW OF SYSTEMS:   Review of Systems  Constitutional: Negative for appetite change, chills, fatigue, fever and unexpected weight change.  HENT:   Negative for mouth sores, nosebleeds, sore throat and trouble swallowing.   Eyes: Negative for  eye problems and icterus.  Respiratory: Negative for cough, hemoptysis, shortness of breath and wheezing.   Cardiovascular: Negative for chest pain and leg swelling.  Gastrointestinal: Negative for abdominal pain, constipation, diarrhea, nausea and vomiting.  Genitourinary: Negative for bladder incontinence, difficulty urinating, dysuria, frequency and hematuria.   Musculoskeletal: Negative for back pain, gait problem, neck pain and neck stiffness.  Skin: Negative for itching and rash.  Neurological: Negative for dizziness, extremity weakness, gait problem, headaches,  light-headedness and seizures.  Hematological: Negative for adenopathy. Does not bruise/bleed easily.  Psychiatric/Behavioral: Negative for confusion, depression and sleep disturbance. The patient is not nervous/anxious.     PHYSICAL EXAMINATION:  There were no vitals taken for this visit.  ECOG PERFORMANCE STATUS: {CHL ONC ECOG [id number]  Physical Exam  Constitutional: Oriented to person, place, and time and well-developed, well-nourished, and in no distress. No distress.  HENT:  Head: Normocephalic and atraumatic.  Mouth/Throat: Oropharynx is clear and moist. No oropharyngeal exudate.  Eyes: Conjunctivae are normal. Right eye exhibits no discharge. Left eye exhibits no discharge. No scleral icterus.  Neck: Normal range of motion. Neck supple.  Cardiovascular: Normal rate, regular rhythm, normal heart sounds and intact distal pulses.   Pulmonary/Chest: Effort normal and breath sounds normal. No respiratory distress. No wheezes. No rales.  Abdominal: Soft. Bowel sounds are normal. Exhibits no distension and no mass. There is no tenderness.  Musculoskeletal: Normal range of motion. Exhibits no edema.  Lymphadenopathy:    No cervical adenopathy.  Neurological: Alert and oriented to person, place, and time. Exhibits normal muscle tone. Gait normal. Coordination normal.  Skin: Skin is warm and dry. No rash noted. Not diaphoretic. No erythema. No pallor.  Psychiatric: Mood, memory and judgment normal.  Vitals reviewed.  LABORATORY DATA: Lab Results  Component Value Date   WBC 5.2 08/16/2022   HGB 10.3 (L) 08/16/2022   HCT 34.7 (L) 08/16/2022   MCV 68.7 (L) 08/16/2022   PLT 383 08/16/2022      Chemistry      Component Value Date/Time   NA 140 06/28/2022 1006   K 4.0 06/28/2022 1006   CL 105 06/28/2022 1006   CO2 25 06/28/2022 1006   BUN 8 06/28/2022 1006   CREATININE 0.79 06/28/2022 1006      Component Value Date/Time   CALCIUM 9.1 06/28/2022 1006   ALKPHOS 82  06/25/2022 1325   AST 20 06/28/2022 1006   ALT 26 06/28/2022 1006   BILITOT 0.2 06/28/2022 1006       RADIOGRAPHIC STUDIES:  No results found.   ASSESSMENT/PLAN:  This is a very pleasant 22 year old Caucasian female referred to clinic for iron deficiency anemia secondary to chronic menstrual blood loss and possible GI blood loss from Crohn's disease.  She has intolerance to iron supplements he receives IV iron as needed with 200 mg IV of Venofer her most recent being on 09/07/2022.  The patient repeat CBC, iron studies, ferritin drawn today.  Her labs today show ***  The iron studies and ferritin are still pending at this time.  She continues to demonstrate significant iron deficiency, then we will reach out to the patient to arrange for IV iron infusions.   Will see the patient back for follow-up visit in 4 months for evaluation repeat blood work.  The patient was advised to call immediately if she has any concerning symptoms in the interval. The patient voices understanding of current disease status and treatment options and is in agreement with the current care plan.  All questions were answered. The patient knows to call the clinic with any problems, questions or concerns. We can certainly see the patient much sooner if necessary      No orders of the defined types were placed in this encounter.    I spent {CHL ONC TIME VISIT - [id number] counseling the patient face to face. The total time spent in the appointment was {CHL ONC TIME VISIT - [id number].  Amayiah Gosnell L Liyah Higham, PA-C 12/14/22

## 2022-12-18 ENCOUNTER — Inpatient Hospital Stay: Payer: 59

## 2022-12-18 ENCOUNTER — Inpatient Hospital Stay: Payer: 59 | Admitting: Physician Assistant

## 2022-12-28 NOTE — Progress Notes (Deleted)
[place] OFFICE PROGRESS NOTE  [place], [place], [place] [address] [place] [address] [place] [address]  DIAGNOSIS: Microcytic anemia related to iron deficiency from chronic menstrual blood losses in addition to possible GI blood losses from Crohn's disease.  She has been intolerant to oral iron replacement.   PRIOR THERAPY: None  CURRENT THERAPY: IV iron with venofer 200 mg PRN, last dose on 09/07/22  INTERVAL HISTORY: Alexis Petty 22 y.o. female returns returns to clinic today for follow-up visit.  The patient last saw Dr. Alen Blew on 08/16/2022.  She is followed by our clinic for iron deficiency anemia secondary to Crohn's disease and menstrual blood loss.  She receives IV iron as needed her most recent being on 09/07/2022.  She has intolerance to oral iron supplements.  Overall the patient is feeling***today.  Fatigue?  Lightheadedness?  Shortness of breath?  Abnormal bleeding or bruising?  Menstrual cycle still lasting 3 to 5 days.  She is here today for evaluation and repeat blood work  MEDICAL HISTORY: Past Medical History:  Diagnosis Date   Abdominal pain    Crohn's colitis ([place])    Diarrhea    Dysmenorrhea    Seen at [facility]   IBS (irritable bowel syndrome)    IBS (irritable bowel syndrome)    Iron deficiency anemia    Thyroid disease    hyperthyroid    ALLERGIES:  is allergic to cocos nucifera, ibuprofen, reglan [metoclopramide], tylenol [acetaminophen], and haldol [haloperidol].  MEDICATIONS:  Current Outpatient Medications  Medication Sig Dispense Refill   ALAYCEN 1/35 tablet Take 1 tablet by mouth daily.     cephALEXin (KEFLEX) 500 MG capsule Take 1 capsule (500 mg total) by mouth 4 (four) times daily. 28 capsule 0   gabapentin (NEURONTIN) 300 MG capsule Take 1 capsule by mouth for 3 days, THEN 1 capsule by mouth twice a daily for 3 days, THEN 1 capsule by mouth 3 times a day.     hydrOXYzine (ATARAX/VISTARIL) 25 MG tablet Take 25 mg by mouth 2  (two) times daily.     ketoconazole (NIZORAL) 2 % cream Apply topically.     levothyroxine (SYNTHROID) 50 MCG tablet Take 75 mcg by mouth daily before breakfast. (Patient not taking: Reported on 08/24/2022)     levothyroxine (SYNTHROID) 75 MCG tablet Take by mouth.     ondansetron (ZOFRAN ODT) 8 MG disintegrating tablet Take 1 tablet (8 mg total) by mouth every 8 (eight) hours as needed for nausea or vomiting. 20 tablet 0   pantoprazole (PROTONIX) 40 MG tablet Take 40 mg by mouth 2 (two) times daily.     promethazine (PHENERGAN) 12.5 MG tablet Take 12.5 mg by mouth every 8 (eight) hours as needed for nausea or vomiting.     promethazine (PHENERGAN) 25 MG suppository Place 1 suppository (25 mg total) rectally every 6 (six) hours as needed for nausea or vomiting. (Patient not taking: Reported on 08/16/2022) 12 each 1   triamcinolone cream (KENALOG) 0.1 % Apply topically.     No current facility-administered medications for this visit.    SURGICAL HISTORY:  Past Surgical History:  Procedure Laterality Date   CHOLECYSTECTOMY     TONSILLECTOMY Bilateral    WISDOM TOOTH EXTRACTION      REVIEW OF SYSTEMS:   Review of Systems  Constitutional: Negative for appetite change, chills, fatigue, fever and unexpected weight change.  HENT:   Negative for mouth sores, nosebleeds, sore throat and trouble swallowing.   Eyes: Negative  for eye problems and icterus.  Respiratory: Negative for cough, hemoptysis, shortness of breath and wheezing.   Cardiovascular: Negative for chest pain and leg swelling.  Gastrointestinal: Negative for abdominal pain, constipation, diarrhea, nausea and vomiting.  Genitourinary: Negative for bladder incontinence, difficulty urinating, dysuria, frequency and hematuria.   Musculoskeletal: Negative for back pain, gait problem, neck pain and neck stiffness.  Skin: Negative for itching and rash.  Neurological: Negative for dizziness, extremity weakness, gait problem, headaches,  light-headedness and seizures.  Hematological: Negative for adenopathy. Does not bruise/bleed easily.  Psychiatric/Behavioral: Negative for confusion, depression and sleep disturbance. The patient is not nervous/anxious.     PHYSICAL EXAMINATION:  There were no vitals taken for this visit.  ECOG PERFORMANCE STATUS: {CHL ONC ECOG [id number]  Physical Exam  Constitutional: Oriented to person, place, and time and well-developed, well-nourished, and in no distress. No distress.  HENT:  Head: Normocephalic and atraumatic.  Mouth/Throat: Oropharynx is clear and moist. No oropharyngeal exudate.  Eyes: Conjunctivae are normal. Right eye exhibits no discharge. Left eye exhibits no discharge. No scleral icterus.  Neck: Normal range of motion. Neck supple.  Cardiovascular: Normal rate, regular rhythm, normal heart sounds and intact distal pulses.   Pulmonary/Chest: Effort normal and breath sounds normal. No respiratory distress. No wheezes. No rales.  Abdominal: Soft. Bowel sounds are normal. Exhibits no distension and no mass. There is no tenderness.  Musculoskeletal: Normal range of motion. Exhibits no edema.  Lymphadenopathy:    No cervical adenopathy.  Neurological: Alert and oriented to person, place, and time. Exhibits normal muscle tone. Gait normal. Coordination normal.  Skin: Skin is warm and dry. No rash noted. Not diaphoretic. No erythema. No pallor.  Psychiatric: Mood, memory and judgment normal.  Vitals reviewed.  LABORATORY DATA: Lab Results  Component Value Date   WBC 5.2 08/16/2022   HGB 10.3 (L) 08/16/2022   HCT 34.7 (L) 08/16/2022   MCV 68.7 (L) 08/16/2022   PLT 383 08/16/2022      Chemistry      Component Value Date/Time   NA 140 06/28/2022 1006   K 4.0 06/28/2022 1006   CL 105 06/28/2022 1006   CO2 25 06/28/2022 1006   BUN 8 06/28/2022 1006   CREATININE 0.79 06/28/2022 1006      Component Value Date/Time   CALCIUM 9.1 06/28/2022 1006   ALKPHOS 82  06/25/2022 1325   AST 20 06/28/2022 1006   ALT 26 06/28/2022 1006   BILITOT 0.2 06/28/2022 1006       RADIOGRAPHIC STUDIES:  No results found.   ASSESSMENT/PLAN:  This is a very pleasant 22 year old Caucasian female referred to clinic for iron deficiency anemia secondary to chronic menstrual blood loss and possible GI blood loss from Crohn's disease.  She has intolerance to iron supplements he receives IV iron as needed with 200 mg IV of Venofer her most recent being on 09/07/2022.  The patient repeat CBC, iron studies, ferritin drawn today.  Her labs today show ***  The iron studies and ferritin are still pending at this time.  She continues to demonstrate significant iron deficiency, then we will reach out to the patient to arrange for IV iron infusions.   Will see the patient back for follow-up visit in 4 months for evaluation repeat blood work.  The patient was advised to call immediately if she has any concerning symptoms in the interval. The patient voices understanding of current disease status and treatment options and is in agreement with the current care  plan. All questions were answered. The patient knows to call the clinic with any problems, questions or concerns. We can certainly see the patient much sooner if necessary    No orders of the defined types were placed in this encounter.    I spent {CHL ONC TIME VISIT - [id number] counseling the patient face to face. The total time spent in the appointment was {CHL ONC TIME VISIT - [id number].  Divina Neale L Kahlel Peake, PA-C 12/28/22

## 2023-01-01 ENCOUNTER — Other Ambulatory Visit: Payer: Self-pay | Admitting: Physician Assistant

## 2023-01-01 ENCOUNTER — Other Ambulatory Visit: Payer: Self-pay

## 2023-01-01 ENCOUNTER — Inpatient Hospital Stay: Payer: 59 | Admitting: Physician Assistant

## 2023-01-01 ENCOUNTER — Inpatient Hospital Stay: Payer: 59

## 2023-01-01 ENCOUNTER — Inpatient Hospital Stay (HOSPITAL_BASED_OUTPATIENT_CLINIC_OR_DEPARTMENT_OTHER): Payer: 59 | Admitting: Physician Assistant

## 2023-01-01 ENCOUNTER — Inpatient Hospital Stay: Payer: 59 | Attending: Oncology

## 2023-01-01 ENCOUNTER — Encounter: Payer: Self-pay | Admitting: Physician Assistant

## 2023-01-01 VITALS — BP 141/86 | HR 73 | Temp 98.4°F | Resp 18 | Ht 63.0 in | Wt 246.2 lb

## 2023-01-01 DIAGNOSIS — N920 Excessive and frequent menstruation with regular cycle: Secondary | ICD-10-CM | POA: Diagnosis not present

## 2023-01-01 DIAGNOSIS — K5090 Crohn's disease, unspecified, without complications: Secondary | ICD-10-CM | POA: Insufficient documentation

## 2023-01-01 DIAGNOSIS — D500 Iron deficiency anemia secondary to blood loss (chronic): Secondary | ICD-10-CM

## 2023-01-01 DIAGNOSIS — Z79899 Other long term (current) drug therapy: Secondary | ICD-10-CM | POA: Insufficient documentation

## 2023-01-01 DIAGNOSIS — D509 Iron deficiency anemia, unspecified: Secondary | ICD-10-CM | POA: Insufficient documentation

## 2023-01-01 DIAGNOSIS — D62 Acute posthemorrhagic anemia: Secondary | ICD-10-CM

## 2023-01-01 LAB — COMPREHENSIVE METABOLIC PANEL
ALT: 19 U/L (ref 0–44)
AST: 14 U/L — ABNORMAL LOW (ref 15–41)
Albumin: 4.3 g/dL (ref 3.5–5.0)
Alkaline Phosphatase: 83 U/L (ref 38–126)
Anion gap: 7 (ref 5–15)
BUN: 9 mg/dL (ref 6–20)
CO2: 29 mmol/L (ref 22–32)
Calcium: 9.4 mg/dL (ref 8.9–10.3)
Chloride: 104 mmol/L (ref 98–111)
Creatinine, Ser: 0.78 mg/dL (ref 0.44–1.00)
GFR, Estimated: >60 mL/min (ref >60)
Glucose, Bld: 93 mg/dL (ref 70–99)
Potassium: 3.9 mmol/L (ref 3.5–5.1)
Sodium: 140 mmol/L (ref 135–145)
Total Bilirubin: 0.3 mg/dL (ref 0.3–1.2)
Total Protein: 7.3 g/dL (ref 6.5–8.1)

## 2023-01-01 LAB — CBC WITH DIFFERENTIAL/PLATELET
Abs Immature Granulocytes: 0.02 10*3/uL (ref 0.00–0.07)
Basophils Absolute: 0 10*3/uL (ref 0.0–0.1)
Basophils Relative: 1 %
Eosinophils Absolute: 0.2 10*3/uL (ref 0.0–0.5)
Eosinophils Relative: 2 %
HCT: 41.5 % (ref 36.0–46.0)
Hemoglobin: 13.9 g/dL (ref 12.0–15.0)
Immature Granulocytes: 0 %
Lymphocytes Relative: 45 %
Lymphs Abs: 3.5 10*3/uL (ref 0.7–4.0)
MCH: 29.3 pg (ref 26.0–34.0)
MCHC: 33.5 g/dL (ref 30.0–36.0)
MCV: 87.4 fL (ref 80.0–100.0)
Monocytes Absolute: 0.5 10*3/uL (ref 0.1–1.0)
Monocytes Relative: 6 %
Neutro Abs: 3.6 10*3/uL (ref 1.7–7.7)
Neutrophils Relative %: 46 %
Platelets: 345 10*3/uL (ref 150–400)
RBC: 4.75 MIL/uL (ref 3.87–5.11)
RDW: 12.5 % (ref 11.5–15.5)
WBC: 7.8 10*3/uL (ref 4.0–10.5)
nRBC: 0 % (ref 0.0–0.2)

## 2023-01-01 LAB — IRON AND IRON BINDING CAPACITY (CC-WL,HP ONLY)
Iron: 49 ug/dL (ref 28–170)
Saturation Ratios: 10 % — ABNORMAL LOW (ref 10.4–31.8)
TIBC: 473 ug/dL — ABNORMAL HIGH (ref 250–450)
UIBC: 424 ug/dL (ref 148–442)

## 2023-01-01 LAB — FERRITIN: Ferritin: 46 ng/mL (ref 11–307)

## 2023-01-01 MED ORDER — INTEGRA PLUS PO CAPS: 1.0000 | ORAL_CAPSULE | Freq: Every morning | ORAL | 2 refills | Status: DC

## 2023-01-01 NOTE — Progress Notes (Signed)
[facility] OFFICE PROGRESS NOTE  [place], [place], [place] [address] [place] [address] [place] [address]  DIAGNOSIS: Microcytic anemia related to iron deficiency from chronic menstrual blood losses in addition to possible GI blood losses from Crohn's disease.  She has been intolerant to oral iron replacement.   PRIOR THERAPY: None  CURRENT THERAPY: : IV iron with venofer 200 mg PRN, last dose on 09/07/22  INTERVAL HISTORY: Alexis Petty 22 y.o. female returns to the clinic today for a followup of visit accompanied by her mother.  The patient last saw Dr. Alen Blew on 08/16/2022.  She is followed by our clinic for iron deficiency anemia secondary to Crohn's disease and menstrual blood loss.  She has chronic constipation and sometimes rectal bleeding from straining. She also has some gingival bleeding with brushing her teeth. She is working on reestablishing with new specialist.  She is currently going to establish care with [facility] for her Crohn's disease and IBS.  She is going to establish with a dentist.  She is also reestablishing with GYN regarding her heavy menstrual cycles.  She states that they are currently working her up for a condition similar to endometriosis.  She has heavy menstrual cycles with associated clotting every 4 weeks.  She is currently on birth control to try and regulate this but she still has heavy cycles.  She also has thyroid dysfunction due to hypothyroidism and has some symptoms associated with this such as hair loss, fatigue, and weight gain.  Regarding her anemia she receives IV iron as needed her most recent being on 09/07/2022.  She reports she did not have any significant treatment with oral iron supplements in the past therefore she does not take this.  She is here today for evaluation and repeat blood work  MEDICAL HISTORY: Past Medical History:  Diagnosis Date   Abdominal pain    Crohn's colitis ([place])    Diarrhea    Dysmenorrhea    Seen at  [facility]   IBS (irritable bowel syndrome)    IBS (irritable bowel syndrome)    Iron deficiency anemia    Thyroid disease    hyperthyroid    ALLERGIES:  is allergic to cocos nucifera, ibuprofen, reglan [metoclopramide], tylenol [acetaminophen], and haldol [haloperidol].  MEDICATIONS:  Current Outpatient Medications  Medication Sig Dispense Refill   ALAYCEN 1/35 tablet Take 1 tablet by mouth daily.     cephALEXin (KEFLEX) 500 MG capsule Take 1 capsule (500 mg total) by mouth 4 (four) times daily. 28 capsule 0   gabapentin (NEURONTIN) 300 MG capsule Take 1 capsule by mouth for 3 days, THEN 1 capsule by mouth twice a daily for 3 days, THEN 1 capsule by mouth 3 times a day.     hydrOXYzine (ATARAX/VISTARIL) 25 MG tablet Take 25 mg by mouth 2 (two) times daily.     ketoconazole (NIZORAL) 2 % cream Apply topically.     levothyroxine (SYNTHROID) 75 MCG tablet Take by mouth.     ondansetron (ZOFRAN ODT) 8 MG disintegrating tablet Take 1 tablet (8 mg total) by mouth every 8 (eight) hours as needed for nausea or vomiting. 20 tablet 0   pantoprazole (PROTONIX) 40 MG tablet Take 40 mg by mouth 2 (two) times daily.     promethazine (PHENERGAN) 12.5 MG tablet Take 12.5 mg by mouth every 8 (eight) hours as needed for nausea or vomiting.     promethazine (PHENERGAN) 25 MG suppository Place 1 suppository (25 mg total) rectally  every 6 (six) hours as needed for nausea or vomiting. 12 each 1   triamcinolone cream (KENALOG) 0.1 % Apply topically.     No current facility-administered medications for this visit.    SURGICAL HISTORY:  Past Surgical History:  Procedure Laterality Date   CHOLECYSTECTOMY     TONSILLECTOMY Bilateral    WISDOM TOOTH EXTRACTION      REVIEW OF SYSTEMS:   Review of Systems  Constitutional: Positive for fatigue.  Negative for appetite change, chills, fever and unexpected weight change.  HENT: Positive for occasional gingival bleeding with brushing her teeth.  Negative for  mouth sores, nosebleeds, sore throat and trouble swallowing.   Eyes: Negative for eye problems and icterus.  Respiratory: Positive for occasional dyspnea on exertion.  Negative for cough, hemoptysis and wheezing.   Cardiovascular: Negative for chest pain and leg swelling.  Gastrointestinal: Positive for chronic constipation.  Positive for occasional rectal bleeding with constipation.  Negative for abdominal pain,  diarrhea, nausea and vomiting.  Genitourinary: Negative for bladder incontinence, difficulty urinating, dysuria, frequency and hematuria.   Musculoskeletal: Negative for back pain, gait problem, neck pain and neck stiffness.  Skin: Negative for itching and rash.  Neurological: Negative for dizziness, extremity weakness, gait problem, headaches, light-headedness and seizures.  Hematological: Negative for adenopathy. Does not bruise/bleed easily.  Psychiatric/Behavioral: Negative for confusion, depression and sleep disturbance. The patient is not nervous/anxious.     PHYSICAL EXAMINATION:  Blood pressure (!) 141/86, pulse 73, temperature 98.4 F (36.9 C), temperature source Oral, resp. rate 18, height 5\' 3"  (1.6 m), weight 246 lb 3.2 oz (111.7 kg), SpO2 98 %.  ECOG PERFORMANCE STATUS: 1  Physical Exam  Constitutional: Oriented to person, place, and time and well-developed, well-nourished, and in no distress.  HENT:  Head: Normocephalic and atraumatic.  Mouth/Throat: Oropharynx is clear and moist. No oropharyngeal exudate.  Eyes: Conjunctivae are normal. Right eye exhibits no discharge. Left eye exhibits no discharge. No scleral icterus.  Neck: Normal range of motion. Neck supple.  Cardiovascular: Normal rate, regular rhythm, normal heart sounds and intact distal pulses.   Pulmonary/Chest: Effort normal and breath sounds normal. No respiratory distress. No wheezes. No rales.  Abdominal: Soft. Bowel sounds are normal. Exhibits no distension and no mass. There is no tenderness.   Musculoskeletal: Normal range of motion. Exhibits no edema.  Lymphadenopathy:    No cervical adenopathy.  Neurological: Alert and oriented to person, place, and time. Exhibits normal muscle tone. Gait normal. Coordination normal.  Skin: Skin is warm and dry. No rash noted. Not diaphoretic. No erythema. No pallor.  Psychiatric: Mood, memory and judgment normal.  Vitals reviewed.  LABORATORY DATA: Lab Results  Component Value Date   WBC 7.8 01/01/2023   HGB 13.9 01/01/2023   HCT 41.5 01/01/2023   MCV 87.4 01/01/2023   PLT 345 01/01/2023      Chemistry      Component Value Date/Time   NA 140 06/28/2022 1006   K 4.0 06/28/2022 1006   CL 105 06/28/2022 1006   CO2 25 06/28/2022 1006   BUN 8 06/28/2022 1006   CREATININE 0.79 06/28/2022 1006      Component Value Date/Time   CALCIUM 9.1 06/28/2022 1006   ALKPHOS 82 06/25/2022 1325   AST 20 06/28/2022 1006   ALT 26 06/28/2022 1006   BILITOT 0.2 06/28/2022 1006       RADIOGRAPHIC STUDIES:  No results found.   ASSESSMENT/PLAN:  This is a very pleasant 22 year old Caucasian female referred  to clinic for iron deficiency anemia secondary to chronic menstrual blood loss and possible GI blood loss from Crohn's disease.  She has intolerance to iron supplements he receives IV iron as needed with 200 mg IV of Venofer her most recent being on 09/07/2022.   The patient was seen with Dr. Julien Nordmann today.  The patient repeat CBC, iron studies, ferritin drawn today.  Her labs today show normal CBC.  Her CMP is also unremarkable.   The iron studies and ferritin are still pending at this time.  If she continues to demonstrate significant iron deficiency, then we will reach out to the patient to arrange for IV iron infusions.   Will see the patient back for follow-up visit in 3 months for evaluation repeat blood work.  She was given a handout on her AVS of iron rich food to increase her dietary intake of iron.  We also discussed if she was  able to tolerate some oral iron few times a week that would be better than none.  I sent her prescription for Integra plus.  If for some reason she is unable to pick this up, we discussed alternatives such as liquid iron, slow release iron, or prenatal iron.  She will continue to follow with her gastroenterologist regarding her Crohn's disease and IBS.  She will follow-up with her PCP regarding her hypothyroidism.  She will continue to follow with GYN regarding her heavy menstrual cycles.  The patient was advised to call immediately if she has any concerning symptoms in the interval. The patient voices understanding of current disease status and treatment options and is in agreement with the current care plan. All questions were answered. The patient knows to call the clinic with any problems, questions or concerns. We can certainly see the patient much sooner if necessary   No orders of the defined types were placed in this encounter.    Santi Troung L Alexias Margerum, PA-C 01/01/23  ADDENDUM: Hematology/Oncology Attending: I had a face-to-face encounter with the patient today.  I reviewed her records and lab and recommended her care plan.  This is a very pleasant 22 years old white female with microcytic anemia secondary to iron deficiency from heavy menstrual loss as well as GI bleeding from Crohn's disease.  The patient is here today to establish care with me after her primary hematologist Dr. Alen Blew left the practice.  She was accompanied by her mother.  The patient is feeling fine today with no concerning complaints.  She was treated with iron infusion in the past.  Last treatment was September 07, 2022. Repeat CBC today showed hemoglobin of 13.9 and hematocrit 41.5%.  Iron study showed normal serum iron of 49 with iron saturation of 10%.  Ferritin level is still pending. The patient has no issues with oral iron tablets.  I recommended for her to take over-the-counter ferrous sulfate at least 3  times a week with vitamin C. I will see her back for follow-up visit in 3 months for evaluation and repeat blood work. She was advised to call immediately if she has any concerning symptoms in the interval. The total time spent in the appointment was 30 minutes. Disclaimer: This note was dictated with voice recognition software. Similar sounding words can inadvertently be transcribed and may be missed upon review. Eilleen Kempf, MD

## 2023-01-02 ENCOUNTER — Telehealth: Payer: Self-pay | Admitting: Internal Medicine

## 2023-01-02 NOTE — Telephone Encounter (Signed)
Scheduled per 03/2 scheduling message, patient has been called and notified of upcoming appointments.

## 2023-01-11 ENCOUNTER — Inpatient Hospital Stay: Payer: Medicare HMO | Attending: Physician Assistant

## 2023-01-11 ENCOUNTER — Other Ambulatory Visit: Payer: Self-pay

## 2023-01-11 ENCOUNTER — Encounter: Payer: Self-pay | Admitting: Physician Assistant

## 2023-01-11 VITALS — BP 123/75 | HR 70 | Temp 98.1°F | Resp 18

## 2023-01-11 DIAGNOSIS — D509 Iron deficiency anemia, unspecified: Secondary | ICD-10-CM | POA: Diagnosis not present

## 2023-01-11 DIAGNOSIS — D500 Iron deficiency anemia secondary to blood loss (chronic): Secondary | ICD-10-CM

## 2023-01-11 MED ORDER — SODIUM CHLORIDE 0.9 % IV SOLN: Freq: Once | INTRAVENOUS | Status: AC

## 2023-01-11 MED ORDER — SODIUM CHLORIDE 0.9 % IV SOLN
300.0000 mg | Freq: Once | INTRAVENOUS | Status: AC
  Administered 2023-01-11: 300 mg via INTRAVENOUS
  Filled 2023-01-11: qty 300

## 2023-01-11 NOTE — Progress Notes (Signed)
Pt states she took 25mg  benadryl at 0730 this morning.

## 2023-01-11 NOTE — Patient Instructions (Signed)

## 2023-01-12 ENCOUNTER — Encounter: Payer: Self-pay | Admitting: Physician Assistant

## 2023-01-18 ENCOUNTER — Inpatient Hospital Stay: Payer: Medicare HMO

## 2023-01-18 ENCOUNTER — Telehealth: Payer: Self-pay | Admitting: Physician Assistant

## 2023-01-18 NOTE — Telephone Encounter (Signed)
Patient called to reschedule missed infusion,patient aware of date and time of appointment.

## 2023-01-25 ENCOUNTER — Inpatient Hospital Stay: Payer: Medicare HMO

## 2023-01-25 ENCOUNTER — Other Ambulatory Visit: Payer: Self-pay

## 2023-01-25 VITALS — BP 118/68 | HR 62 | Temp 98.3°F | Resp 17

## 2023-01-25 DIAGNOSIS — D509 Iron deficiency anemia, unspecified: Secondary | ICD-10-CM | POA: Diagnosis not present

## 2023-01-25 DIAGNOSIS — D500 Iron deficiency anemia secondary to blood loss (chronic): Secondary | ICD-10-CM

## 2023-01-25 MED ORDER — SODIUM CHLORIDE 0.9 % IV SOLN
300.0000 mg | Freq: Once | INTRAVENOUS | Status: AC
  Administered 2023-01-25: 300 mg via INTRAVENOUS
  Filled 2023-01-25: qty 300

## 2023-01-25 MED ORDER — DIPHENHYDRAMINE HCL 25 MG PO CAPS: 25.0000 mg | ORAL_CAPSULE | Freq: Once | ORAL | Status: DC

## 2023-01-25 MED ORDER — SODIUM CHLORIDE 0.9 % IV SOLN: Freq: Once | INTRAVENOUS | Status: AC

## 2023-01-25 NOTE — Patient Instructions (Signed)

## 2023-01-25 NOTE — Progress Notes (Signed)
Pt tolerated her Venofer infusion well without difficulty, declined to stay for 30 minute observation. Vital signs stable, ambulatory at discharge.

## 2023-02-01 ENCOUNTER — Inpatient Hospital Stay: Payer: Medicare HMO

## 2023-02-08 ENCOUNTER — Encounter (HOSPITAL_COMMUNITY): Payer: Self-pay

## 2023-02-08 ENCOUNTER — Other Ambulatory Visit: Payer: Self-pay

## 2023-02-08 ENCOUNTER — Encounter: Payer: Self-pay | Admitting: Physician Assistant

## 2023-02-08 ENCOUNTER — Emergency Department (HOSPITAL_COMMUNITY)
Admission: EM | Admit: 2023-02-08 | Discharge: 2023-02-08 | Disposition: A | Discharge status: Home / Self Care | Payer: Medicare HMO | Attending: Emergency Medicine | Admitting: Emergency Medicine

## 2023-02-08 ENCOUNTER — Emergency Department (HOSPITAL_COMMUNITY): Payer: Medicare HMO

## 2023-02-08 DIAGNOSIS — R112 Nausea with vomiting, unspecified: Secondary | ICD-10-CM | POA: Diagnosis not present

## 2023-02-08 DIAGNOSIS — R000 Tachycardia, unspecified: Secondary | ICD-10-CM | POA: Diagnosis not present

## 2023-02-08 DIAGNOSIS — R1084 Generalized abdominal pain: Secondary | ICD-10-CM | POA: Diagnosis not present

## 2023-02-08 DIAGNOSIS — R197 Diarrhea, unspecified: Secondary | ICD-10-CM | POA: Insufficient documentation

## 2023-02-08 DIAGNOSIS — I959 Hypotension, unspecified: Secondary | ICD-10-CM | POA: Diagnosis not present

## 2023-02-08 DIAGNOSIS — K760 Fatty (change of) liver, not elsewhere classified: Secondary | ICD-10-CM | POA: Diagnosis not present

## 2023-02-08 DIAGNOSIS — Z743 Need for continuous supervision: Secondary | ICD-10-CM | POA: Diagnosis not present

## 2023-02-08 LAB — COMPREHENSIVE METABOLIC PANEL
ALT: 32 U/L (ref 0–44)
AST: 29 U/L (ref 15–41)
Albumin: 3.7 g/dL (ref 3.5–5.0)
Alkaline Phosphatase: 84 U/L (ref 38–126)
Anion gap: 11 (ref 5–15)
BUN: 6 mg/dL (ref 6–20)
CO2: 21 mmol/L — ABNORMAL LOW (ref 22–32)
Calcium: 8.8 mg/dL — ABNORMAL LOW (ref 8.9–10.3)
Chloride: 106 mmol/L (ref 98–111)
Creatinine, Ser: 0.81 mg/dL (ref 0.44–1.00)
GFR, Estimated: >60 mL/min (ref >60)
Glucose, Bld: 131 mg/dL — ABNORMAL HIGH (ref 70–99)
Potassium: 3.5 mmol/L (ref 3.5–5.1)
Sodium: 138 mmol/L (ref 135–145)
Total Bilirubin: 0.5 mg/dL (ref 0.3–1.2)
Total Protein: 7.1 g/dL (ref 6.5–8.1)

## 2023-02-08 LAB — CBC
HCT: 43 % (ref 36.0–46.0)
Hemoglobin: 14.2 g/dL (ref 12.0–15.0)
MCH: 29 pg (ref 26.0–34.0)
MCHC: 33 g/dL (ref 30.0–36.0)
MCV: 87.8 fL (ref 80.0–100.0)
Platelets: 257 10*3/uL (ref 150–400)
RBC: 4.9 MIL/uL (ref 3.87–5.11)
RDW: 12.3 % (ref 11.5–15.5)
WBC: 12.2 10*3/uL — ABNORMAL HIGH (ref 4.0–10.5)
nRBC: 0 % (ref 0.0–0.2)

## 2023-02-08 LAB — URINALYSIS, ROUTINE W REFLEX MICROSCOPIC
Glucose, UA: NEGATIVE mg/dL
Hgb urine dipstick: NEGATIVE
Ketones, ur: NEGATIVE mg/dL
Nitrite: NEGATIVE
Protein, ur: 30 mg/dL — AB
Specific Gravity, Urine: 1.028 (ref 1.005–1.030)
pH: 8 (ref 5.0–8.0)

## 2023-02-08 LAB — I-STAT BETA HCG BLOOD, ED (MC, WL, AP ONLY): I-stat hCG, quantitative: <5 m[IU]/mL (ref <5)

## 2023-02-08 LAB — LIPASE, BLOOD: Lipase: 28 U/L (ref 11–51)

## 2023-02-08 IMAGING — CT CT ABD-PELV W/ CM
2 of 4 series · 17 of 46 positions shown, 19 images · IV contrast (OMNIPAQUE 350)
Comparison: 04/23/2021

CLINICAL DATA: Abdominal abscess/infection suspected. Nausea,
vomiting, diarrhea. Abdominal discomfort. History of Crohn's
disease.

EXAM:
CT ABDOMEN AND PELVIS WITH CONTRAST
TECHNIQUE: Multidetector CT imaging of the abdomen and pelvis was performed
using the standard protocol following bolus administration of
intravenous contrast.
CONTRAST:  80mL OMNIPAQUE IOHEXOL 350 MG/ML SOLN

[Series 2: axial st · axial · 0.72mm/px · z∈[+1213,+1608]mm · 14 of 89 slices shown, 16 images]
[im 5/89  soft-tissue]
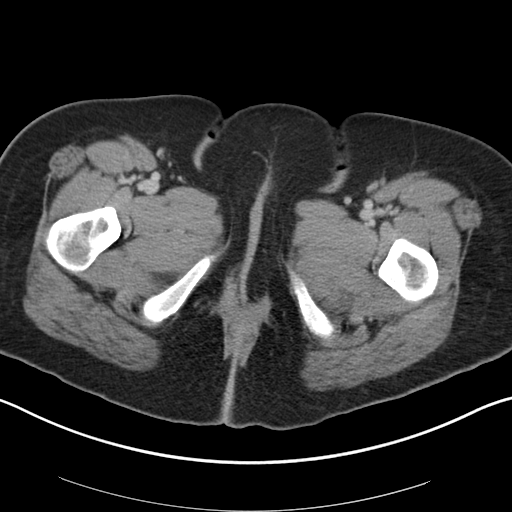
[im 5/89  bone]
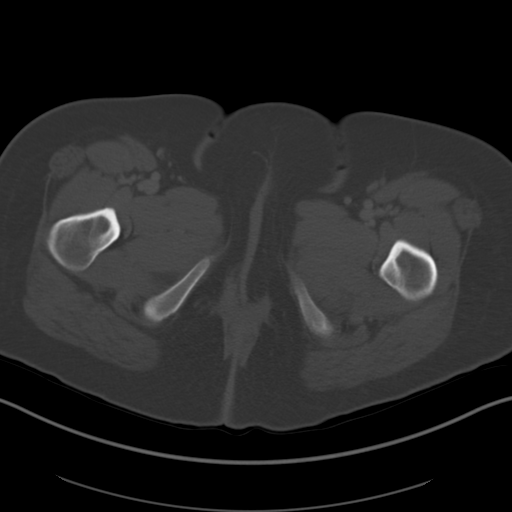
[im 10/89  soft-tissue]
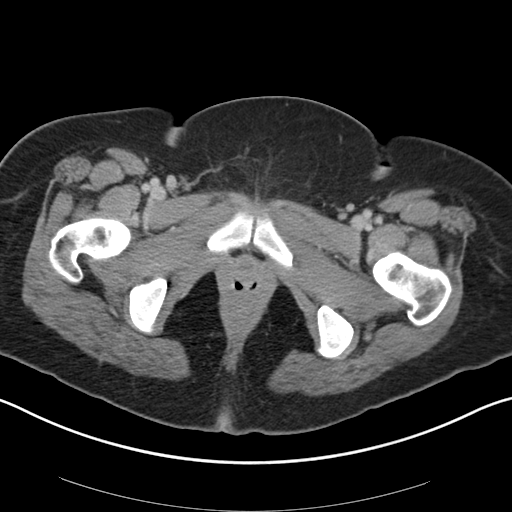
[im 19/89  soft-tissue]
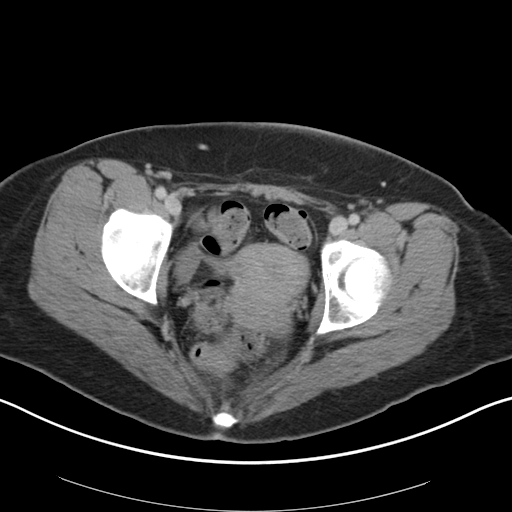
[im 24/89  soft-tissue]
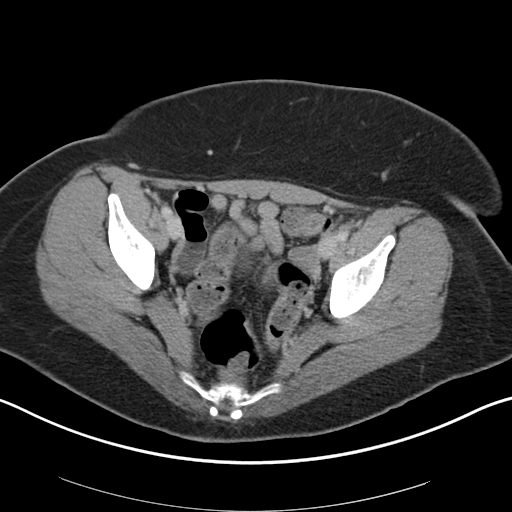
[im 28/89  soft-tissue]
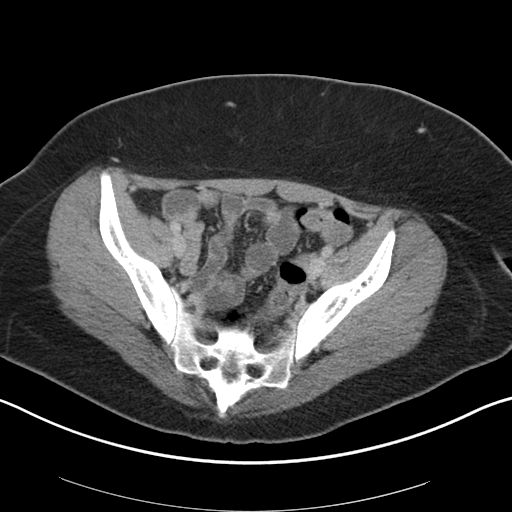
[im 38/89  soft-tissue]
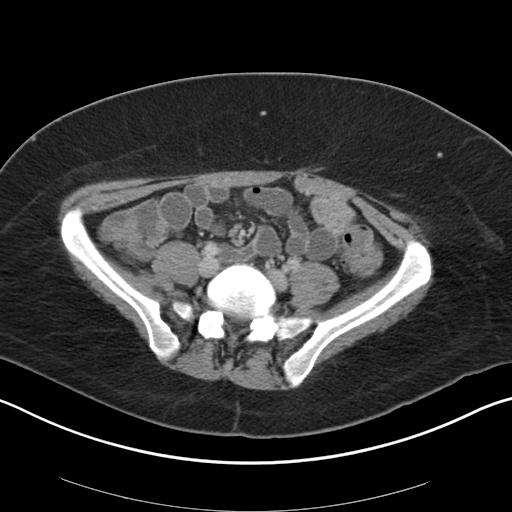
[im 42/89  soft-tissue]
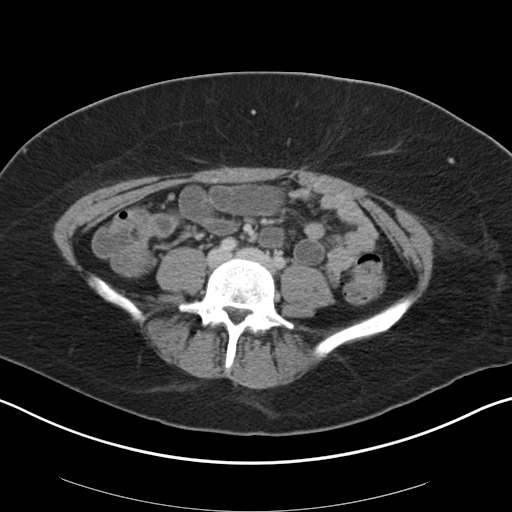
[im 47/89  soft-tissue]
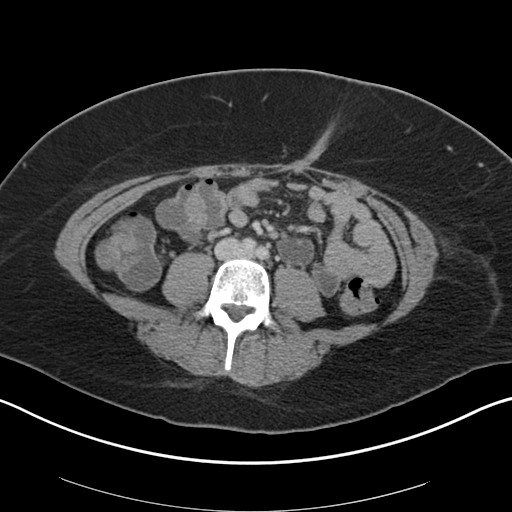
[im 51/89  soft-tissue]
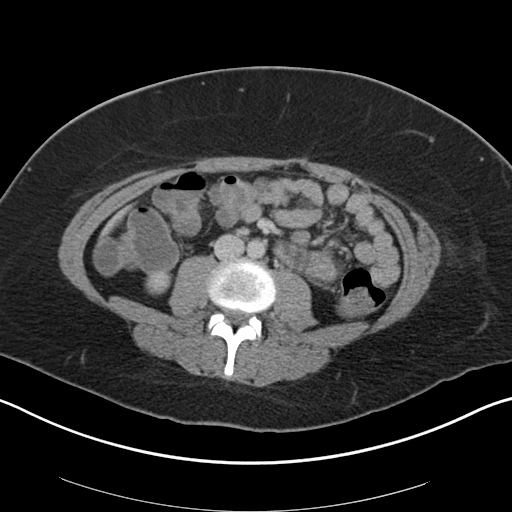
[im 51/89  bone]
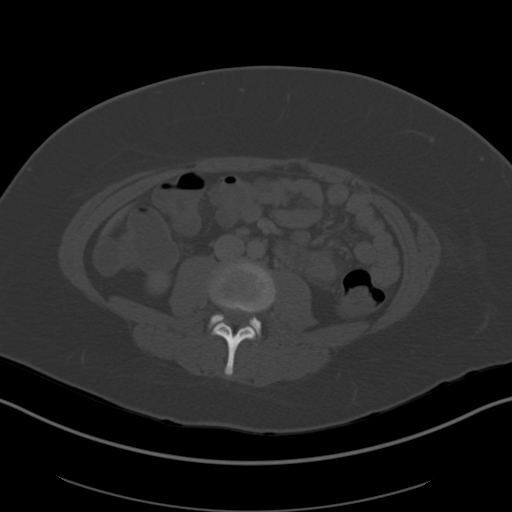
[im 61/89  soft-tissue]
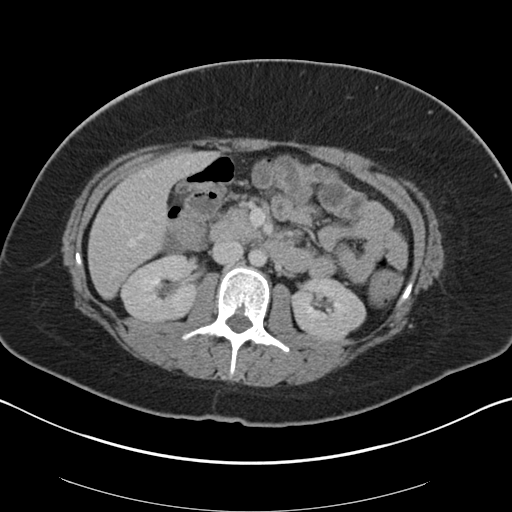
[im 65/89  soft-tissue]
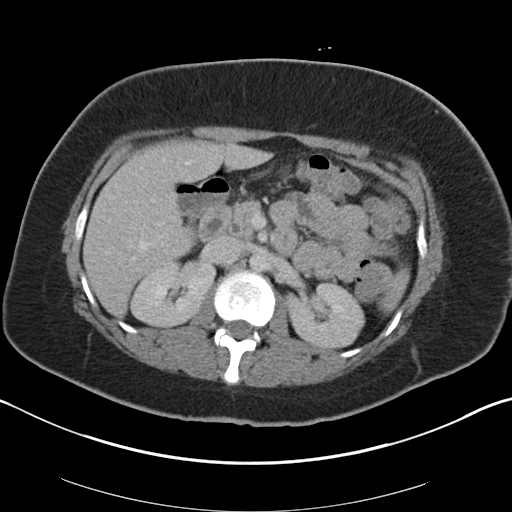
[im 70/89  soft-tissue]
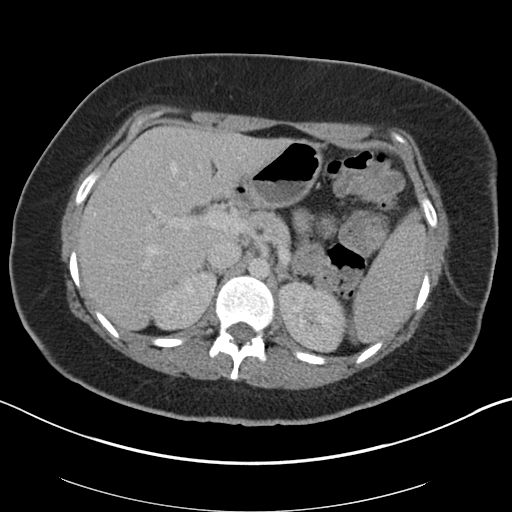
[im 79/89  soft-tissue]
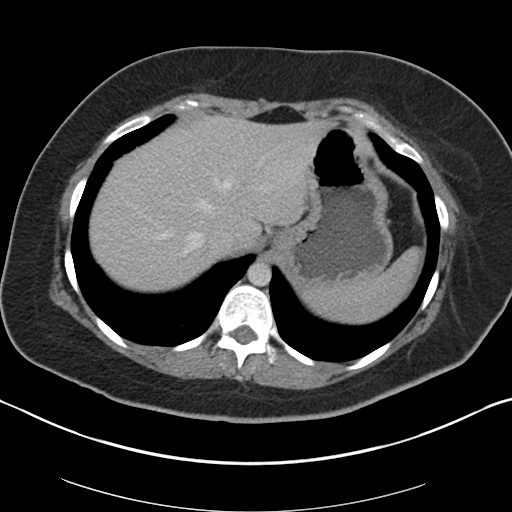
[im 84/89  soft-tissue]
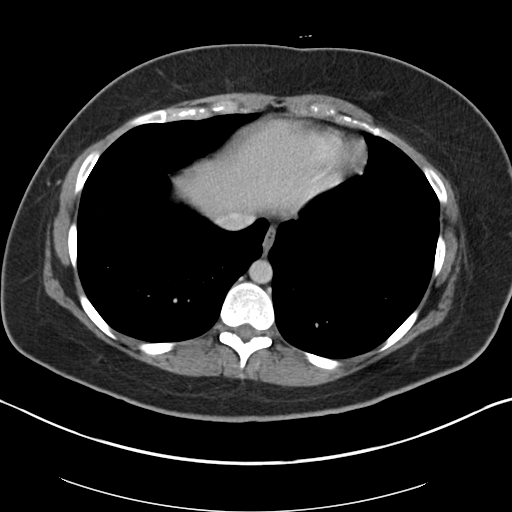

[Series 4: coronal st · coronal · 0.85mm/px · 3 of 109 slices shown]
[im 37/109  soft-tissue]
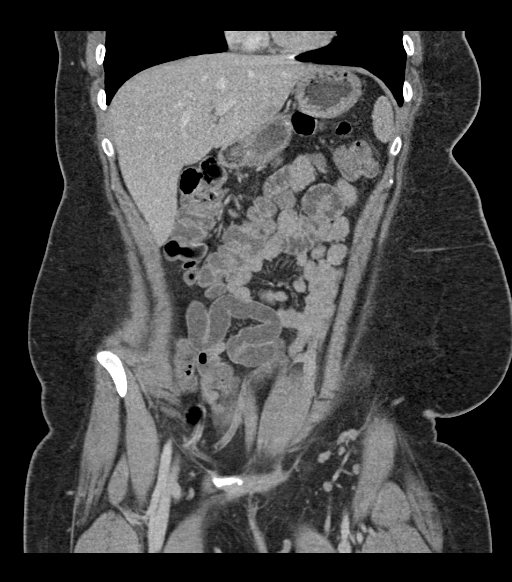
[im 49/109  soft-tissue]
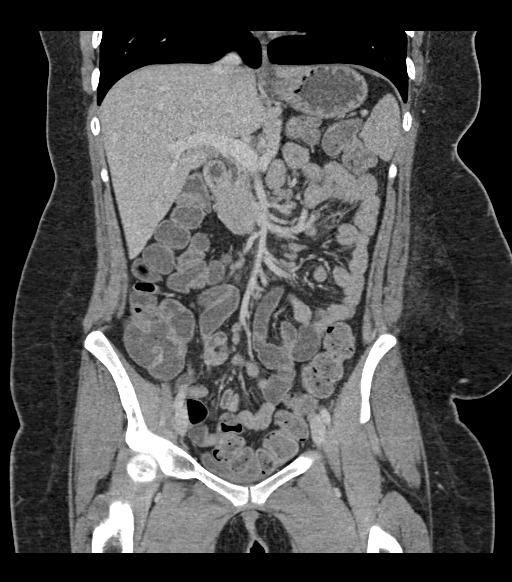
[im 61/109  soft-tissue]
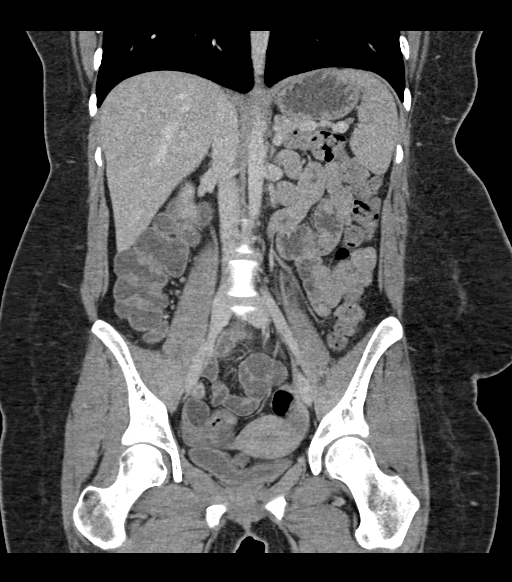

[17 of 46 positions shown; findings below may reference images not displayed]

FINDINGS: Lower chest: Lung bases are clear. No effusions. Heart is normal
size.

Hepatobiliary: No focal liver abnormality is seen. Status post
cholecystectomy. No biliary dilatation.

Pancreas: No focal abnormality or ductal dilatation.

Spleen: No focal abnormality.  Normal size.

Adrenals/Urinary Tract: No adrenal abnormality. No focal renal
abnormality. No stones or hydronephrosis. Urinary bladder is
unremarkable.

Stomach/Bowel: Normal appendix. Stomach, large and small bowel
grossly unremarkable.

Vascular/Lymphatic: No evidence of aneurysm or adenopathy. Mildly
prominent scattered mesenteric lymph nodes centrally and in the
right lower quadrant could reflect mesenteric adenitis.

Reproductive: Uterus and adnexa unremarkable.  No mass.

Other: No free fluid or free air.

Musculoskeletal: No acute bony abnormality
IMPRESSION: Normal appendix.

Scattered prominent mesenteric lymph nodes could reflect mesenteric
adenitis.

## 2023-02-08 MED ORDER — ONDANSETRON 4 MG PO TBDP: 4.0000 mg | ORAL_TABLET | Freq: Three times a day (TID) | ORAL | 0 refills | Status: DC | PRN

## 2023-02-08 MED ORDER — IOHEXOL 300 MG/ML  SOLN
100.0000 mL | Freq: Once | INTRAMUSCULAR | Status: AC | PRN
  Administered 2023-02-08: 100 mL via INTRAVENOUS

## 2023-02-08 MED ORDER — PROMETHAZINE HCL 25 MG PO TABS: 25.0000 mg | ORAL_TABLET | Freq: Three times a day (TID) | ORAL | 0 refills | Status: DC | PRN

## 2023-02-08 MED ORDER — PROMETHAZINE HCL 25 MG RE SUPP: 25.0000 mg | Freq: Three times a day (TID) | RECTAL | 0 refills | Status: DC | PRN

## 2023-02-08 MED ORDER — SODIUM CHLORIDE (PF) 0.9 % IJ SOLN
INTRAMUSCULAR | Status: AC
  Filled 2023-02-08: qty 50

## 2023-02-08 MED ORDER — HYDROMORPHONE HCL 1 MG/ML IJ SOLN
0.5000 mg | Freq: Once | INTRAMUSCULAR | Status: AC
  Administered 2023-02-08: 0.5 mg via INTRAVENOUS
  Filled 2023-02-08: qty 1

## 2023-02-08 MED ORDER — SODIUM CHLORIDE 0.9 % IV SOLN
12.5000 mg | Freq: Four times a day (QID) | INTRAVENOUS | Status: DC | PRN
  Administered 2023-02-08: 12.5 mg via INTRAVENOUS
  Filled 2023-02-08: qty 12.5

## 2023-02-08 MED ORDER — SODIUM CHLORIDE 0.9 % IV BOLUS
1000.0000 mL | Freq: Once | INTRAVENOUS | Status: AC
  Administered 2023-02-08: 1000 mL via INTRAVENOUS

## 2023-02-08 NOTE — Discharge Instructions (Addendum)
Please follow-up with your gastroenterologist or primary care doctor as soon as possible for your ED visit.  I prescribed you Zofran and Phenergan as tablets he can take by mouth.  As a rescue medication, I also prescribed you rectal Phenergan suppositories which can use if you are having bad vomiting and cannot swallow pills.

## 2023-02-08 NOTE — ED Provider Notes (Signed)
[facility] Provider Note   CSN: [id number] Arrival date & time: 02/08/23  0620     History {Add pertinent medical, surgical, social history, OB history to HPI:1} Chief Complaint  Patient presents with   Emesis   Abdominal Pain    Alexis Petty is a 22 y.o. female presenting to ED with complaint of nausea, vomiting, abdominal pain.  The patient reports that she suffers from Crohn's disease, she is not on maintenance medications now she has between GI providers, and she began having epigastric pain yesterday after eating.  She said the pain has persisted all night, it feels different than her typical Crohn's, and she had several episodes of vomiting.  She unfortunately has allergies or intolerance to multiple pain and nausea medications, including ibuprofen, Tylenol, Reglan, Haldol.  She does use Zofran and Phenergan at home, although she has not been able to keep these 2 medications down.  Her only  surgical history of the abdomen is cholecystectomy.  HPI     Home Medications Prior to Admission medications   Medication Sig Start Date End Date Taking? Authorizing Provider  ALAYCEN 1/35 tablet Take 1 tablet by mouth daily. 08/19/20   [provider]  FeFum-FePoly-FA-B Cmp-C-Biot (INTEGRA PLUS) CAPS Take 1 capsule by mouth every morning. 01/01/23   Heilingoetter, Cassandra L, PA-C  gabapentin (NEURONTIN) 300 MG capsule Take 1 capsule by mouth for 3 days, THEN 1 capsule by mouth twice a daily for 3 days, THEN 1 capsule by mouth 3 times a day. 08/10/22   [provider]  ketoconazole (NIZORAL) 2 % cream Apply topically. 06/14/22   [provider]  levothyroxine (SYNTHROID) 75 MCG tablet Take by mouth. 07/12/22   [provider]  ondansetron (ZOFRAN ODT) 8 MG disintegrating tablet Take 1 tablet (8 mg total) by mouth every 8 (eight) hours as needed for nausea or vomiting. 06/23/21   Lorre Nick, MD   pantoprazole (PROTONIX) 40 MG tablet Take 40 mg by mouth 2 (two) times daily. 08/24/20   [provider]  promethazine (PHENERGAN) 12.5 MG tablet Take 12.5 mg by mouth every 8 (eight) hours as needed for nausea or vomiting. 02/22/21   [provider]  promethazine (PHENERGAN) 25 MG suppository Place 1 suppository (25 mg total) rectally every 6 (six) hours as needed for nausea or vomiting. 04/23/21   Molpus, John, MD  triamcinolone cream (KENALOG) 0.1 % Apply topically. 08/10/22   [provider]      Allergies    Cocos nucifera, Ibuprofen, Reglan [metoclopramide], Tylenol [acetaminophen], and Haldol [haloperidol]    Review of Systems   Review of Systems  Physical Exam Updated Vital Signs BP (!) 156/106 (BP Location: Right Arm)   Pulse (!) 119   Temp 98.5 F (36.9 C) (Oral)   Resp 19   SpO2 100%  Physical Exam Constitutional:      General: She is not in acute distress. HENT:     Head: Normocephalic and atraumatic.  Eyes:     Conjunctiva/sclera: Conjunctivae normal.     Pupils: Pupils are equal, round, and reactive to light.  Cardiovascular:     Rate and Rhythm: Normal rate and regular rhythm.  Pulmonary:     Effort: Pulmonary effort is normal. No respiratory distress.  Abdominal:     General: There is no distension.     Tenderness: There is abdominal tenderness in the epigastric area and left upper quadrant.  Skin:    General: Skin is  warm and dry.  Neurological:     General: No focal deficit present.     Mental Status: She is alert. Mental status is at baseline.  Psychiatric:        Mood and Affect: Mood normal.        Behavior: Behavior normal.     ED Results / Procedures / Treatments   Labs (all labs ordered are listed, but only abnormal results are displayed) Labs Reviewed  COMPREHENSIVE METABOLIC PANEL - Abnormal; Notable for the following components:      Result Value   CO2 21 (*)    Glucose, Bld 131 (*)    Calcium 8.8 (*)    All  other components within normal limits  CBC - Abnormal; Notable for the following components:   WBC 12.2 (*)    All other components within normal limits  LIPASE, BLOOD  URINALYSIS, ROUTINE W REFLEX MICROSCOPIC  I-STAT BETA HCG BLOOD, ED ([facility], [facility], [facility] ONLY)    EKG None  Radiology No results found.  Procedures Procedures  {Document cardiac monitor, telemetry assessment procedure when appropriate:1}  Medications Ordered in ED Medications  HYDROmorphone (DILAUDID) injection 0.5 mg (has no administration in time range)  promethazine (PHENERGAN) 12.5 mg in sodium chloride 0.9 % 50 mL IVPB (has no administration in time range)  sodium chloride 0.9 % bolus 1,000 mL (has no administration in time range)    ED Course/ Medical Decision Making/ A&P   {   Click here for ABCD2, HEART and other calculatorsREFRESH Note before signing :1}                          Medical Decision Making Amount and/or Complexity of Data Reviewed Labs: ordered. Radiology: ordered.  Risk Prescription drug management.   This patient presents to the ED with concern for abdominal pain, nausea vomiting. This involves an extensive number of treatment options, and is a complaint that carries with it a high risk of complications and morbidity.  The differential diagnosis includes ileus versus obstruction versus diverticulitis versus colitis versus Crohn's exacerbation versus pancreatitis versus other  Co-morbidities that complicate the patient evaluation: Reported history of Crohn's disease, at high risk of inflammatory bowel disease exacerbation  Patient was noted on multiple CT scans of the abdomen in 2022, during visits to the ED for abdominal pain, which showed some mesenteric adenitis but no other emergent findings.  I discussed this with the patient, and clarified that the current pain and symptoms she is experiencing are quite different from those in the past.  She has significant abdominal tenderness on exam.   Therefore, given this clinical presentation, we will proceed with CT scanning of the abdomen today.  External records from outside source obtained and reviewed including colonoscopy 11/02/20 at [facility] showing: Findings  Mild, patchy erythematous mucosa in the rectosigmoid; performed 1 cold  forceps biopsy to rule out IBD  All observed locations appeared normal, including the transverse colon,  descending colon and proximal sigmoid colon. Performed 2 random biopsies.  Overall however visibility was poor due to poor bowel prep.  Perianal skin tag present.   I ordered and personally interpreted labs.  The pertinent results include:  WBC 12.2. Lipase and lft wnl.  CMP unremarkable  I ordered imaging studies including CT abdomen pelvis I independently visualized and interpreted imaging which showed *** I agree with the radiologist interpretation  The patient was maintained on a cardiac monitor.  I personally viewed and  interpreted the cardiac monitored which showed an underlying rhythm of: NSR  I ordered medication including IV fluids, nausea and pain medications  I have reviewed the patients home medicines and have made adjustments as needed  Test Considered: doubt ovarian torsion; no emergent indication for pelvic ultrasound at this time  I requested consultation with the ***,  and discussed lab and imaging findings as well as pertinent plan - they recommend: ***  After the interventions noted above, I reevaluated the patient and found that they have: {resolved/improved/worsened:23923::"improved"}  Social Determinants of Health:***  Dispostion:  After consideration of the diagnostic results and the patients response to treatment, I feel that the patent would benefit from ***.   {Document critical care time when appropriate:1} {Document review of labs and clinical decision tools ie heart score, Chads2Vasc2 etc:1}  {Document your independent review of radiology images, and  any outside records:1} {Document your discussion with family members, caretakers, and with consultants:1} {Document social determinants of health affecting pt's care:1} {Document your decision making why or why not admission, treatments were needed:1} Final Clinical Impression(s) / ED Diagnoses Final diagnoses:  None    Rx / DC Orders ED Discharge Orders     None

## 2023-02-08 NOTE — ED Triage Notes (Signed)
Patient brought in by [facility], reports abd pain and vomiting for the last 3 hours. Has hx of crohns and IBS. States she went to bed last night feeling a little off and thought she would wake up feeling sick. EMS states they gave her 4 mg IV zofran pta and NS. Patient had also taken odt zofran at home. Patient vomiting still in triage.

## 2023-02-08 NOTE — ED Notes (Signed)
Contacted pharmacy for PRN meds

## 2023-03-15 DIAGNOSIS — R112 Nausea with vomiting, unspecified: Secondary | ICD-10-CM | POA: Diagnosis not present

## 2023-03-15 DIAGNOSIS — K625 Hemorrhage of anus and rectum: Secondary | ICD-10-CM | POA: Diagnosis not present

## 2023-03-15 DIAGNOSIS — R1013 Epigastric pain: Secondary | ICD-10-CM | POA: Diagnosis not present

## 2023-03-15 DIAGNOSIS — K5900 Constipation, unspecified: Secondary | ICD-10-CM | POA: Diagnosis not present

## 2023-03-29 ENCOUNTER — Telehealth: Payer: Self-pay | Admitting: Internal Medicine

## 2023-03-29 NOTE — Telephone Encounter (Signed)
Called patient regarding June appointments, called both daughter and mothers number both numbers are either invalid or do not have voicemail set up.

## 2023-03-30 NOTE — Progress Notes (Deleted)
[facility] OFFICE PROGRESS NOTE  [name], [place], [name] [address] [place] [address] [place] [address]  DIAGNOSIS:  Microcytic anemia related to iron deficiency from chronic menstrual blood losses in addition to possible GI blood losses from Crohn's disease.  She has been intolerant to oral iron replacement.   PRIOR THERAPY:  None   CURRENT THERAPY: 1) IV iron with venofer 200 mg PRN, last dose on 01/25/23 2) ***check if tried integra, prenatal iron, or slow release***  INTERVAL HISTORY: Alexis Petty 22 y.o. female returns  to the clinic today for a followup of visit accompanied by her mother. She is followed by our clinic for iron deficiency anemia secondary to Crohn's disease and menstrual blood loss.  She has chronic constipation and sometimes rectal bleeding from straining. She also has some gingival bleeding with brushing her teeth.  Did you establish with dentist and GI at [facility]??? She is currently going to establish care with [facility] for her Crohn's disease and IBS.   Did you establish with gyn***She is also reestablishing with GYN regarding her heavy menstrual cycles. She states that they are currently working her up for a condition similar to endometriosis. She has heavy menstrual cycles with associated clotting every 4 weeks. She is currently on birth control to try and regulate this but she still has heavy cycles. She also has thyroid dysfunction due to hypothyroidism and has some symptoms associated with this such as hair loss, fatigue, and weight gain.   Regarding her anemia she receives IV iron as needed her most recent being on 01/25/23.  In the interval since last being seen, she started taking oral iron supplements with?  Prenatal?  Integra plus?  Slow release?  She has been tolerating this ***.  Since undergoing iron infusions, her symptoms have ***fatigue?  Shortness of breath?  Lightheadedness?  Any other abnormal bleeding or bruising?  She is here today  for evaluation repeat blood work.Marland Kitchen    MEDICAL HISTORY: Past Medical History:  Diagnosis Date   Abdominal pain    Crohn's colitis (HCC)    Diarrhea    Dysmenorrhea    Seen at [facility]   IBS (irritable bowel syndrome)    IBS (irritable bowel syndrome)    Iron deficiency anemia    Thyroid disease    hyperthyroid    ALLERGIES:  is allergic to cocos nucifera, ibuprofen, reglan [metoclopramide], tylenol [acetaminophen], and haldol [haloperidol].  MEDICATIONS:  Current Outpatient Medications  Medication Sig Dispense Refill   ALAYCEN 1/35 tablet Take 1 tablet by mouth daily.     FeFum-FePoly-FA-B Cmp-C-Biot (INTEGRA PLUS) CAPS Take 1 capsule by mouth every morning. 30 capsule 2   gabapentin (NEURONTIN) 300 MG capsule Take 1 capsule by mouth for 3 days, THEN 1 capsule by mouth twice a daily for 3 days, THEN 1 capsule by mouth 3 times a day.     ketoconazole (NIZORAL) 2 % cream Apply topically.     levothyroxine (SYNTHROID) 75 MCG tablet Take by mouth.     ondansetron (ZOFRAN ODT) 8 MG disintegrating tablet Take 1 tablet (8 mg total) by mouth every 8 (eight) hours as needed for nausea or vomiting. 20 tablet 0   ondansetron (ZOFRAN-ODT) 4 MG disintegrating tablet Take 1 tablet (4 mg total) by mouth every 8 (eight) hours as needed for up to 12 doses for nausea or vomiting. 12 tablet 0   pantoprazole (PROTONIX) 40 MG tablet Take 40 mg by mouth 2 (two) times daily.  promethazine (PHENERGAN) 12.5 MG tablet Take 12.5 mg by mouth every 8 (eight) hours as needed for nausea or vomiting.     promethazine (PHENERGAN) 25 MG suppository Place 1 suppository (25 mg total) rectally every 6 (six) hours as needed for nausea or vomiting. 12 each 1   promethazine (PHENERGAN) 25 MG suppository Place 1 suppository (25 mg total) rectally every 8 (eight) hours as needed for up to 6 doses for nausea or vomiting. 6 each 0   promethazine (PHENERGAN) 25 MG tablet Take 1 tablet (25 mg total) by mouth every 8 (eight)  hours as needed for up to 6 doses for nausea or vomiting. 6 tablet 0   triamcinolone cream (KENALOG) 0.1 % Apply topically.     No current facility-administered medications for this visit.    SURGICAL HISTORY:  Past Surgical History:  Procedure Laterality Date   CHOLECYSTECTOMY     TONSILLECTOMY Bilateral    WISDOM TOOTH EXTRACTION      REVIEW OF SYSTEMS:   Review of Systems  Constitutional: Negative for appetite change, chills, fatigue, fever and unexpected weight change.  HENT:   Negative for mouth sores, nosebleeds, sore throat and trouble swallowing.   Eyes: Negative for eye problems and icterus.  Respiratory: Negative for cough, hemoptysis, shortness of breath and wheezing.   Cardiovascular: Negative for chest pain and leg swelling.  Gastrointestinal: Negative for abdominal pain, constipation, diarrhea, nausea and vomiting.  Genitourinary: Negative for bladder incontinence, difficulty urinating, dysuria, frequency and hematuria.   Musculoskeletal: Negative for back pain, gait problem, neck pain and neck stiffness.  Skin: Negative for itching and rash.  Neurological: Negative for dizziness, extremity weakness, gait problem, headaches, light-headedness and seizures.  Hematological: Negative for adenopathy. Does not bruise/bleed easily.  Psychiatric/Behavioral: Negative for confusion, depression and sleep disturbance. The patient is not nervous/anxious.     PHYSICAL EXAMINATION:  There were no vitals taken for this visit.  ECOG PERFORMANCE STATUS: {CHL ONC ECOG [id number]  Physical Exam  Constitutional: Oriented to person, place, and time and well-developed, well-nourished, and in no distress. No distress.  HENT:  Head: Normocephalic and atraumatic.  Mouth/Throat: Oropharynx is clear and moist. No oropharyngeal exudate.  Eyes: Conjunctivae are normal. Right eye exhibits no discharge. Left eye exhibits no discharge. No scleral icterus.  Neck: Normal range of motion. Neck  supple.  Cardiovascular: Normal rate, regular rhythm, normal heart sounds and intact distal pulses.   Pulmonary/Chest: Effort normal and breath sounds normal. No respiratory distress. No wheezes. No rales.  Abdominal: Soft. Bowel sounds are normal. Exhibits no distension and no mass. There is no tenderness.  Musculoskeletal: Normal range of motion. Exhibits no edema.  Lymphadenopathy:    No cervical adenopathy.  Neurological: Alert and oriented to person, place, and time. Exhibits normal muscle tone. Gait normal. Coordination normal.  Skin: Skin is warm and dry. No rash noted. Not diaphoretic. No erythema. No pallor.  Psychiatric: Mood, memory and judgment normal.  Vitals reviewed.  LABORATORY DATA: Lab Results  Component Value Date   WBC 12.2 (H) 02/08/2023   HGB 14.2 02/08/2023   HCT 43.0 02/08/2023   MCV 87.8 02/08/2023   PLT 257 02/08/2023      Chemistry      Component Value Date/Time   NA 138 02/08/2023 0637   K 3.5 02/08/2023 0637   CL 106 02/08/2023 0637   CO2 21 (L) 02/08/2023 0637   BUN 6 02/08/2023 0637   CREATININE 0.81 02/08/2023 0637   CREATININE 0.79 06/28/2022 1006  Component Value Date/Time   CALCIUM 8.8 (L) 02/08/2023 0637   ALKPHOS 84 02/08/2023 0637   AST 29 02/08/2023 0637   ALT 32 02/08/2023 0637   BILITOT 0.5 02/08/2023 0637       RADIOGRAPHIC STUDIES:  No results found.   ASSESSMENT/PLAN:  This is a very pleasant 22 year old Caucasian female referred to clinic for iron deficiency anemia secondary to chronic menstrual blood loss and possible GI blood loss from Crohn's disease.   She has intolerance to iron supplements he receives IV iron as needed with 200 mg IV of Venofer her most recent being on 09/07/2022.    The patient was seen with Dr. Arbutus Ped today.  The patient repeat CBC, iron studies, ferritin drawn today.  Her labs today show normal CBC***  Her CMP is also unremarkable.   he iron studies and ferritin are still pending at this  time.  If she continues to demonstrate significant iron deficiency, then we will reach out to the patient to arrange for IV iron infusions.    Will see the patient back for follow-up visit in 3 months for evaluation repeat blood work.    She will continue taking ***  She will continue to follow with GI regarding her Crohn's disease and IBS.  She will follow with her PCP for her hypothyroidism.  She will follow with GYN for heavy menstrual cycles.  The patient was advised to call immediately if she has any concerning symptoms in the interval. The patient voices understanding of current disease status and treatment options and is in agreement with the current care plan. All questions were answered. The patient knows to call the clinic with any problems, questions or concerns. We can certainly see the patient much sooner if necessary        No orders of the defined types were placed in this encounter.    I spent {CHL ONC TIME VISIT - UJWJX:[id number]} counseling the patient face to face. The total time spent in the appointment was {CHL ONC TIME VISIT - ZHYQM:[id number]}.  Jenyfer Trawick L Chelsy Parrales, PA-C 03/30/23

## 2023-04-01 ENCOUNTER — Inpatient Hospital Stay: Payer: Medicare HMO | Admitting: Physician Assistant

## 2023-04-01 ENCOUNTER — Inpatient Hospital Stay: Payer: Medicare HMO | Attending: Physician Assistant

## 2023-04-03 ENCOUNTER — Other Ambulatory Visit: Payer: 59

## 2023-04-03 ENCOUNTER — Ambulatory Visit: Payer: 59 | Admitting: Physician Assistant

## 2023-04-08 DIAGNOSIS — L309 Dermatitis, unspecified: Secondary | ICD-10-CM | POA: Diagnosis not present

## 2023-04-08 DIAGNOSIS — L304 Erythema intertrigo: Secondary | ICD-10-CM | POA: Diagnosis not present

## 2023-04-24 ENCOUNTER — Telehealth: Payer: Self-pay | Admitting: Physician Assistant

## 2023-04-25 ENCOUNTER — Inpatient Hospital Stay: Payer: Medicare HMO

## 2023-04-25 DIAGNOSIS — F1120 Opioid dependence, uncomplicated: Secondary | ICD-10-CM | POA: Diagnosis not present

## 2023-04-30 ENCOUNTER — Other Ambulatory Visit: Payer: Self-pay

## 2023-04-30 ENCOUNTER — Inpatient Hospital Stay: Payer: Medicare HMO | Attending: Physician Assistant

## 2023-04-30 VITALS — BP 116/62 | HR 62 | Temp 98.2°F | Resp 18

## 2023-04-30 DIAGNOSIS — Z79899 Other long term (current) drug therapy: Secondary | ICD-10-CM | POA: Diagnosis not present

## 2023-04-30 DIAGNOSIS — D509 Iron deficiency anemia, unspecified: Secondary | ICD-10-CM | POA: Insufficient documentation

## 2023-04-30 DIAGNOSIS — D500 Iron deficiency anemia secondary to blood loss (chronic): Secondary | ICD-10-CM

## 2023-04-30 DIAGNOSIS — F1120 Opioid dependence, uncomplicated: Secondary | ICD-10-CM | POA: Diagnosis not present

## 2023-04-30 MED ORDER — SODIUM CHLORIDE 0.9 % IV SOLN
300.0000 mg | Freq: Once | INTRAVENOUS | Status: AC
  Administered 2023-04-30: 300 mg via INTRAVENOUS
  Filled 2023-04-30: qty 300

## 2023-04-30 MED ORDER — DIPHENHYDRAMINE HCL 25 MG PO CAPS
25.0000 mg | ORAL_CAPSULE | Freq: Once | ORAL | Status: AC
  Administered 2023-04-30: 25 mg via ORAL
  Filled 2023-04-30: qty 1

## 2023-04-30 MED ORDER — SODIUM CHLORIDE 0.9 % IV SOLN: Freq: Once | INTRAVENOUS | Status: AC

## 2023-04-30 NOTE — Progress Notes (Signed)
Patient declined 30 minute post iron infusion wait VSS and ambulatory to the lobby.

## 2023-04-30 NOTE — Patient Instructions (Signed)
Iron Sucrose Injection What is this medication? IRON SUCROSE (EYE ern SOO krose) treats low levels of iron (iron deficiency anemia) in people with kidney disease. Iron is a mineral that plays an important role in making red blood cells, which carry oxygen from your lungs to the rest of your body. This medicine may be used for other purposes; ask your health care provider or pharmacist if you have questions. COMMON BRAND NAME(S): Venofer What should I tell my care team before I take this medication? They need to know if you have any of these conditions: Anemia not caused by low iron levels Heart disease High levels of iron in the blood Kidney disease Liver disease An unusual or allergic reaction to iron, other medications, foods, dyes, or preservatives Pregnant or trying to get pregnant Breastfeeding How should I use this medication? This medication is for infusion into a vein. It is given in a hospital or clinic setting. Talk to your care team about the use of this medication in children. While this medication may be prescribed for children as young as 2 years for selected conditions, precautions do apply. Overdosage: If you think you have taken too much of this medicine contact a poison control center or emergency room at once. NOTE: This medicine is only for you. Do not share this medicine with others. What if I miss a dose? Keep appointments for follow-up doses. It is important not to miss your dose. Call your care team if you are unable to keep an appointment. What may interact with this medication? Do not take this medication with any of the following: Deferoxamine Dimercaprol Other iron products This medication may also interact with the following: Chloramphenicol Deferasirox This list may not describe all possible interactions. Give your health care provider a list of all the medicines, herbs, non-prescription drugs, or dietary supplements you use. Also tell them if you smoke,  drink alcohol, or use illegal drugs. Some items may interact with your medicine. What should I watch for while using this medication? Visit your care team regularly. Tell your care team if your symptoms do not start to get better or if they get worse. You may need blood work done while you are taking this medication. You may need to follow a special diet. Talk to your care team. Foods that contain iron include: whole grains/cereals, dried fruits, beans, or peas, leafy green vegetables, and organ meats (liver, kidney). What side effects may I notice from receiving this medication? Side effects that you should report to your care team as soon as possible: Allergic reactions--skin rash, itching, hives, swelling of the face, lips, tongue, or throat Low blood pressure--dizziness, feeling faint or lightheaded, blurry vision Shortness of breath Side effects that usually do not require medical attention (report to your care team if they continue or are bothersome): Flushing Headache Joint pain Muscle pain Nausea Pain, redness, or irritation at injection site This list may not describe all possible side effects. Call your doctor for medical advice about side effects. You may report side effects to FDA at 1-800-FDA-1088. Where should I keep my medication? This medication is given in a hospital or clinic and will not be stored at home. NOTE: This sheet is a summary. It may not cover all possible information. If you have questions about this medicine, talk to your doctor, pharmacist, or health care provider.  2023 Elsevier/Gold Standard (2021-01-05 00:00:00)  

## 2023-05-06 DIAGNOSIS — F1120 Opioid dependence, uncomplicated: Secondary | ICD-10-CM | POA: Diagnosis not present

## 2023-05-09 NOTE — Progress Notes (Deleted)
[facility] OFFICE PROGRESS NOTE  [place], [place], [name] [address] [place] [address]  DIAGNOSIS: Microcytic anemia related to iron deficiency from chronic menstrual blood losses in addition to possible GI blood losses from Crohn's disease.  She has been intolerant to oral iron replacement.   PRIOR THERAPY: None   CURRENT THERAPY: 1)  IV iron with venofer 300 mg PRN, last dose on 04/30/23  2) ***oral iron ***prescription or not  INTERVAL HISTORY: Alexis Petty 22 y.o. female returns returns to the clinic today for a followup of visit accompanied by her mother.   The patient last saw the clinic in March 2024.  She is followed by our clinic for iron deficiency anemia secondary to Crohn's disease and menstrual blood loss.  She has chronic constipation and sometimes rectal bleeding from straining. She also has some gingival bleeding with brushing her teeth.  She is currently going to establish care with [facility] for her Crohn's disease and IBS***.  She is going to establish with a dentist.***  She is also reestablishing*** with GYN regarding her heavy menstrual cycles.  She states that they are currently working her up for a condition similar to endometriosis.  She has heavy menstrual cycles with associated clotting every 4 weeks.  She is currently on birth control*** to try and regulate this but she still has heavy cycles.  She also has thyroid dysfunction due to hypothyroidism and has some symptoms associated with this such as hair loss, fatigue, and weight gain.   Regarding her anemia she receives IV iron as needed her most recent being on 04/30/2023.  She reports she did not have any significant treatment with oral iron supplements in the past therefore she does not take this***.  She is here today for evaluation and repeat blood work  MEDICAL HISTORY: Past Medical History:  Diagnosis Date   Abdominal pain    Crohn's colitis (HCC)    Diarrhea     Dysmenorrhea    Seen at [facility]   IBS (irritable bowel syndrome)    IBS (irritable bowel syndrome)    Iron deficiency anemia    Thyroid disease    hyperthyroid    ALLERGIES:  is allergic to cocos nucifera, ibuprofen, reglan [metoclopramide], tylenol [acetaminophen], and haldol [haloperidol].  MEDICATIONS:  Current Outpatient Medications  Medication Sig Dispense Refill   ALAYCEN 1/35 tablet Take 1 tablet by mouth daily.     FeFum-FePoly-FA-B Cmp-C-Biot (INTEGRA PLUS) CAPS Take 1 capsule by mouth every morning. 30 capsule 2   gabapentin (NEURONTIN) 300 MG capsule Take 1 capsule by mouth for 3 days, THEN 1 capsule by mouth twice a daily for 3 days, THEN 1 capsule by mouth 3 times a day.     ketoconazole (NIZORAL) 2 % cream Apply topically.     levothyroxine (SYNTHROID) 75 MCG tablet Take by mouth.     ondansetron (ZOFRAN ODT) 8 MG disintegrating tablet Take 1 tablet (8 mg total) by mouth every 8 (eight) hours as needed for nausea or vomiting. 20 tablet 0   ondansetron (ZOFRAN-ODT) 4 MG disintegrating tablet Take 1 tablet (4 mg total) by mouth every 8 (eight) hours as needed for up to 12 doses for nausea or vomiting. 12 tablet 0   pantoprazole (PROTONIX) 40 MG tablet Take 40 mg by mouth 2 (two) times daily.     promethazine (PHENERGAN) 12.5 MG tablet Take 12.5 mg by mouth every 8 (eight) hours as needed for nausea or vomiting.  promethazine (PHENERGAN) 25 MG suppository Place 1 suppository (25 mg total) rectally every 6 (six) hours as needed for nausea or vomiting. 12 each 1   promethazine (PHENERGAN) 25 MG suppository Place 1 suppository (25 mg total) rectally every 8 (eight) hours as needed for up to 6 doses for nausea or vomiting. 6 each 0   promethazine (PHENERGAN) 25 MG tablet Take 1 tablet (25 mg total) by mouth every 8 (eight) hours as needed for up to 6 doses for nausea or vomiting. 6 tablet 0   triamcinolone cream (KENALOG) 0.1 % Apply topically.     No current  facility-administered medications for this visit.    SURGICAL HISTORY:  Past Surgical History:  Procedure Laterality Date   CHOLECYSTECTOMY     TONSILLECTOMY Bilateral    WISDOM TOOTH EXTRACTION      REVIEW OF SYSTEMS:   Review of Systems  Constitutional: Negative for appetite change, chills, fatigue, fever and unexpected weight change.  HENT:   Negative for mouth sores, nosebleeds, sore throat and trouble swallowing.   Eyes: Negative for eye problems and icterus.  Respiratory: Negative for cough, hemoptysis, shortness of breath and wheezing.   Cardiovascular: Negative for chest pain and leg swelling.  Gastrointestinal: Negative for abdominal pain, constipation, diarrhea, nausea and vomiting.  Genitourinary: Negative for bladder incontinence, difficulty urinating, dysuria, frequency and hematuria.   Musculoskeletal: Negative for back pain, gait problem, neck pain and neck stiffness.  Skin: Negative for itching and rash.  Neurological: Negative for dizziness, extremity weakness, gait problem, headaches, light-headedness and seizures.  Hematological: Negative for adenopathy. Does not bruise/bleed easily.  Psychiatric/Behavioral: Negative for confusion, depression and sleep disturbance. The patient is not nervous/anxious.     PHYSICAL EXAMINATION:  There were no vitals taken for this visit.  ECOG PERFORMANCE STATUS: {CHL ONC ECOG [id number]  Physical Exam  Constitutional: Oriented to person, place, and time and well-developed, well-nourished, and in no distress. No distress.  HENT:  Head: Normocephalic and atraumatic.  Mouth/Throat: Oropharynx is clear and moist. No oropharyngeal exudate.  Eyes: Conjunctivae are normal. Right eye exhibits no discharge. Left eye exhibits no discharge. No scleral icterus.  Neck: Normal range of motion. Neck supple.  Cardiovascular: Normal rate, regular rhythm, normal heart sounds and intact distal pulses.   Pulmonary/Chest: Effort normal and  breath sounds normal. No respiratory distress. No wheezes. No rales.  Abdominal: Soft. Bowel sounds are normal. Exhibits no distension and no mass. There is no tenderness.  Musculoskeletal: Normal range of motion. Exhibits no edema.  Lymphadenopathy:    No cervical adenopathy.  Neurological: Alert and oriented to person, place, and time. Exhibits normal muscle tone. Gait normal. Coordination normal.  Skin: Skin is warm and dry. No rash noted. Not diaphoretic. No erythema. No pallor.  Psychiatric: Mood, memory and judgment normal.  Vitals reviewed.  LABORATORY DATA: Lab Results  Component Value Date   WBC 12.2 (H) 02/08/2023   HGB 14.2 02/08/2023   HCT 43.0 02/08/2023   MCV 87.8 02/08/2023   PLT 257 02/08/2023      Chemistry      Component Value Date/Time   NA 138 02/08/2023 0637   K 3.5 02/08/2023 0637   CL 106 02/08/2023 0637   CO2 21 (L) 02/08/2023 0637   BUN 6 02/08/2023 0637   CREATININE 0.81 02/08/2023 0637   CREATININE 0.79 06/28/2022 1006      Component Value Date/Time   CALCIUM 8.8 (L) 02/08/2023 0637   ALKPHOS 84 02/08/2023 0637   AST 29  02/08/2023 0637   ALT 32 02/08/2023 0637   BILITOT 0.5 02/08/2023 9811       RADIOGRAPHIC STUDIES:  No results found.   ASSESSMENT/PLAN:  This is a very pleasant 22 year old Caucasian female referred to clinic for iron deficiency anemia secondary to chronic menstrual blood loss and possible GI blood loss from Crohn's disease.   She has intolerance to iron supplements he receives IV iron as needed with 300 mg IV of Venofer her most recent being on 04/30/2023.    The patient was seen with Dr. Arbutus Ped today.  The patient repeat CBC, iron studies, ferritin drawn today.  Her labs today show normal*** CBC.  Her ***CMP is also unremarkable.   The iron studies and ferritin are still pending at this time.  If she continues to demonstrate significant iron deficiency, then we will reach out to the patient to arrange for IV iron  infusions.    Will see the patient back for follow-up visit in 3 months for evaluation repeat blood work.   ***She was given a handout on her AVS of iron rich food to increase her dietary intake of iron. We also discussed if she was able to tolerate some oral iron few times a week that would be better than none. I sent her prescription for Integra plus. If for some reason she is unable to pick this up, we discussed alternatives such as liquid iron, slow release iron, or prenatal iron. ***      No orders of the defined types were placed in this encounter.    I spent {CHL ONC TIME VISIT - BJYNW:[id number]} counseling the patient face to face. The total time spent in the appointment was {CHL ONC TIME VISIT - VHQIO:[id number]}.   L , PA-C 05/09/23

## 2023-05-13 ENCOUNTER — Other Ambulatory Visit: Payer: Medicare HMO

## 2023-05-13 ENCOUNTER — Ambulatory Visit: Payer: Medicare HMO | Admitting: Physician Assistant

## 2023-05-13 DIAGNOSIS — F1120 Opioid dependence, uncomplicated: Secondary | ICD-10-CM | POA: Diagnosis not present

## 2023-05-13 DIAGNOSIS — Z30011 Encounter for initial prescription of contraceptive pills: Secondary | ICD-10-CM | POA: Diagnosis not present

## 2023-05-13 DIAGNOSIS — R102 Pelvic and perineal pain: Secondary | ICD-10-CM | POA: Diagnosis not present

## 2023-05-13 DIAGNOSIS — G8929 Other chronic pain: Secondary | ICD-10-CM | POA: Diagnosis not present

## 2023-05-13 DIAGNOSIS — Z1330 Encounter for screening examination for mental health and behavioral disorders, unspecified: Secondary | ICD-10-CM | POA: Diagnosis not present

## 2023-05-14 ENCOUNTER — Inpatient Hospital Stay: Payer: Medicare HMO | Attending: Physician Assistant

## 2023-05-14 ENCOUNTER — Inpatient Hospital Stay: Payer: Medicare HMO | Admitting: Physician Assistant

## 2023-05-15 ENCOUNTER — Telehealth: Payer: Self-pay | Admitting: Physician Assistant

## 2023-05-15 NOTE — Telephone Encounter (Signed)
Left patient message regarding upcoming appointment times/dates

## 2023-05-20 DIAGNOSIS — F1120 Opioid dependence, uncomplicated: Secondary | ICD-10-CM | POA: Diagnosis not present

## 2023-05-22 NOTE — Progress Notes (Deleted)
[facility] OFFICE PROGRESS NOTE  [place], [place], [name] [address] [place] [address]  DIAGNOSIS: Microcytic anemia related to iron deficiency from chronic menstrual blood losses in addition to possible GI blood losses from Crohn's disease.  She has been intolerant to oral iron replacement.   PRIOR THERAPY: None  CURRENT THERAPY: 1) IV iron with venofer 200 mg PRN, last dose on 01/25/23 2) ***check if tried integra, prenatal iron, or slow release***  INTERVAL HISTORY: Alexis Petty 22 y.o. female returns returns  to the clinic today for a followup of visit accompanied by her mother. She is followed by our clinic for iron deficiency anemia secondary to Crohn's disease and menstrual blood loss.  She has chronic constipation and sometimes rectal bleeding from straining. She also has some gingival bleeding with brushing her teeth.  Did you establish with dentist and GI at [facility]??? She is currently going to establish care with [facility] for her Crohn's disease and IBS.   Did you establish with gyn***She is also reestablishing with GYN regarding her heavy menstrual cycles. She states that they are currently working her up for a condition similar to endometriosis. She has heavy menstrual cycles with associated clotting every 4 weeks. She is currently on birth control to try and regulate this but she still has heavy cycles. She also has thyroid dysfunction due to hypothyroidism and has some symptoms associated with this such as hair loss, fatigue, and weight gain.   Regarding her anemia she receives IV iron as needed her most recent being on 01/25/23.  In the interval since last being seen, she started taking oral iron supplements with?  Prenatal?  Integra plus?  Slow release?  She has been tolerating this ***.  Since undergoing iron infusions, her symptoms have ***fatigue?  Shortness of breath?  Lightheadedness?  Any other abnormal bleeding or bruising?  She is here  today for evaluation repeat blood work.Marland Kitchen    MEDICAL HISTORY: Past Medical History:  Diagnosis Date   Abdominal pain    Crohn's colitis (HCC)    Diarrhea    Dysmenorrhea    Seen at [facility]   IBS (irritable bowel syndrome)    IBS (irritable bowel syndrome)    Iron deficiency anemia    Thyroid disease    hyperthyroid    ALLERGIES:  is allergic to cocos nucifera, ibuprofen, reglan [metoclopramide], tylenol [acetaminophen], and haldol [haloperidol].  MEDICATIONS:  Current Outpatient Medications  Medication Sig Dispense Refill   ALAYCEN 1/35 tablet Take 1 tablet by mouth daily.     FeFum-FePoly-FA-B Cmp-C-Biot (INTEGRA PLUS) CAPS Take 1 capsule by mouth every morning. 30 capsule 2   gabapentin (NEURONTIN) 300 MG capsule Take 1 capsule by mouth for 3 days, THEN 1 capsule by mouth twice a daily for 3 days, THEN 1 capsule by mouth 3 times a day.     ketoconazole (NIZORAL) 2 % cream Apply topically.     levothyroxine (SYNTHROID) 75 MCG tablet Take by mouth.     ondansetron (ZOFRAN ODT) 8 MG disintegrating tablet Take 1 tablet (8 mg total) by mouth every 8 (eight) hours as needed for nausea or vomiting. 20 tablet 0   ondansetron (ZOFRAN-ODT) 4 MG disintegrating tablet Take 1 tablet (4 mg total) by mouth every 8 (eight) hours as needed for up to 12 doses for nausea or vomiting. 12 tablet 0   pantoprazole (PROTONIX) 40 MG tablet Take 40 mg by mouth 2 (two) times daily.  promethazine (PHENERGAN) 12.5 MG tablet Take 12.5 mg by mouth every 8 (eight) hours as needed for nausea or vomiting.     promethazine (PHENERGAN) 25 MG suppository Place 1 suppository (25 mg total) rectally every 6 (six) hours as needed for nausea or vomiting. 12 each 1   promethazine (PHENERGAN) 25 MG suppository Place 1 suppository (25 mg total) rectally every 8 (eight) hours as needed for up to 6 doses for nausea or vomiting. 6 each 0   promethazine (PHENERGAN) 25 MG tablet Take 1 tablet (25 mg total) by mouth every 8  (eight) hours as needed for up to 6 doses for nausea or vomiting. 6 tablet 0   triamcinolone cream (KENALOG) 0.1 % Apply topically.     No current facility-administered medications for this visit.    SURGICAL HISTORY:  Past Surgical History:  Procedure Laterality Date   CHOLECYSTECTOMY     TONSILLECTOMY Bilateral    WISDOM TOOTH EXTRACTION      REVIEW OF SYSTEMS:   Review of Systems  Constitutional: Negative for appetite change, chills, fatigue, fever and unexpected weight change.  HENT:   Negative for mouth sores, nosebleeds, sore throat and trouble swallowing.   Eyes: Negative for eye problems and icterus.  Respiratory: Negative for cough, hemoptysis, shortness of breath and wheezing.   Cardiovascular: Negative for chest pain and leg swelling.  Gastrointestinal: Negative for abdominal pain, constipation, diarrhea, nausea and vomiting.  Genitourinary: Negative for bladder incontinence, difficulty urinating, dysuria, frequency and hematuria.   Musculoskeletal: Negative for back pain, gait problem, neck pain and neck stiffness.  Skin: Negative for itching and rash.  Neurological: Negative for dizziness, extremity weakness, gait problem, headaches, light-headedness and seizures.  Hematological: Negative for adenopathy. Does not bruise/bleed easily.  Psychiatric/Behavioral: Negative for confusion, depression and sleep disturbance. The patient is not nervous/anxious.     PHYSICAL EXAMINATION:  There were no vitals taken for this visit.  ECOG PERFORMANCE STATUS: {CHL ONC ECOG [id number]  Physical Exam  Constitutional: Oriented to person, place, and time and well-developed, well-nourished, and in no distress. No distress.  HENT:  Head: Normocephalic and atraumatic.  Mouth/Throat: Oropharynx is clear and moist. No oropharyngeal exudate.  Eyes: Conjunctivae are normal. Right eye exhibits no discharge. Left eye exhibits no discharge. No scleral icterus.  Neck: Normal range of  motion. Neck supple.  Cardiovascular: Normal rate, regular rhythm, normal heart sounds and intact distal pulses.   Pulmonary/Chest: Effort normal and breath sounds normal. No respiratory distress. No wheezes. No rales.  Abdominal: Soft. Bowel sounds are normal. Exhibits no distension and no mass. There is no tenderness.  Musculoskeletal: Normal range of motion. Exhibits no edema.  Lymphadenopathy:    No cervical adenopathy.  Neurological: Alert and oriented to person, place, and time. Exhibits normal muscle tone. Gait normal. Coordination normal.  Skin: Skin is warm and dry. No rash noted. Not diaphoretic. No erythema. No pallor.  Psychiatric: Mood, memory and judgment normal.  Vitals reviewed.  LABORATORY DATA: Lab Results  Component Value Date   WBC 12.2 (H) 02/08/2023   HGB 14.2 02/08/2023   HCT 43.0 02/08/2023   MCV 87.8 02/08/2023   PLT 257 02/08/2023      Chemistry      Component Value Date/Time   NA 138 02/08/2023 0637   K 3.5 02/08/2023 0637   CL 106 02/08/2023 0637   CO2 21 (L) 02/08/2023 0637   BUN 6 02/08/2023 0637   CREATININE 0.81 02/08/2023 0637   CREATININE 0.79 06/28/2022 1006  Component Value Date/Time   CALCIUM 8.8 (L) 02/08/2023 0637   ALKPHOS 84 02/08/2023 0637   AST 29 02/08/2023 0637   ALT 32 02/08/2023 0637   BILITOT 0.5 02/08/2023 0637       RADIOGRAPHIC STUDIES:  No results found.   ASSESSMENT/PLAN:  This is a very pleasant 22 year old Caucasian female referred to clinic for iron deficiency anemia secondary to chronic menstrual blood loss and possible GI blood loss from Crohn's disease.   She has intolerance to iron supplements he receives IV iron as needed with 200 mg IV of Venofer her most recent being on 09/07/2022.    The patient was seen with Dr. Arbutus Ped today.  The patient repeat CBC, iron studies, ferritin drawn today.  Her labs today show normal CBC***  Her CMP is also unremarkable.   he iron studies and ferritin are still  pending at this time.  If she continues to demonstrate significant iron deficiency, then we will reach out to the patient to arrange for IV iron infusions.    Will see the patient back for follow-up visit in 3 months for evaluation repeat blood work.    She will continue taking ***  She will continue to follow with GI regarding her Crohn's disease and IBS.  She will follow with her PCP for her hypothyroidism.  She will follow with GYN for heavy menstrual cycles.  The patient was advised to call immediately if she has any concerning symptoms in the interval. The patient voices understanding of current disease status and treatment options and is in agreement with the current care plan. All questions were answered. The patient knows to call the clinic with any problems, questions or concerns. We can certainly see the patient much sooner if necessary   No orders of the defined types were placed in this encounter.    I spent {CHL ONC TIME VISIT - WUJWJ:[id number]} counseling the patient face to face. The total time spent in the appointment was {CHL ONC TIME VISIT - OZHYQ:[id number]}.  Bryne Lindon L Francille Wittmann, PA-C 05/22/23

## 2023-05-27 ENCOUNTER — Inpatient Hospital Stay: Payer: Medicare HMO

## 2023-05-27 ENCOUNTER — Inpatient Hospital Stay: Payer: Medicare HMO | Admitting: Physician Assistant

## 2023-05-27 DIAGNOSIS — F1120 Opioid dependence, uncomplicated: Secondary | ICD-10-CM | POA: Diagnosis not present

## 2023-05-30 ENCOUNTER — Telehealth: Payer: Self-pay | Admitting: Physician Assistant

## 2023-05-30 NOTE — Telephone Encounter (Signed)
Patient is aware of scheduled appointment times/dates

## 2023-06-03 DIAGNOSIS — F1120 Opioid dependence, uncomplicated: Secondary | ICD-10-CM | POA: Diagnosis not present

## 2023-06-07 NOTE — Progress Notes (Deleted)
[facility] OFFICE PROGRESS NOTE  [place], [place], [name] [address] [place] [address]  DIAGNOSIS: Microcytic anemia related to iron deficiency from chronic menstrual blood losses in addition to possible GI blood losses from Crohn's disease.  She has been intolerant to oral iron replacement.   PRIOR THERAPY: None   CURRENT THERAPY: 1) IV iron with venofer 200 mg PRN, last dose on 01/25/23 2) ***check if tried integra, prenatal iron, or slow release***  INTERVAL HISTORY: Alexis Petty 22 y.o. female returns returns  to the clinic today for a followup of visit accompanied by her mother. She is followed by our clinic for iron deficiency anemia secondary to Crohn's disease and menstrual blood loss.  She has chronic constipation and sometimes rectal bleeding from straining. She also has some gingival bleeding with brushing her teeth.  Did you establish with dentist and GI at [facility]??? She is currently going to establish care with [facility] for her Crohn's disease and IBS.   Did you establish with gyn***She is also reestablishing with GYN regarding her heavy menstrual cycles. She states that they are currently working her up for a condition similar to endometriosis. She has heavy menstrual cycles with associated clotting every 4 weeks. She is currently on birth control to try and regulate this but she still has heavy cycles. She also has thyroid dysfunction due to hypothyroidism and has some symptoms associated with this such as hair loss, fatigue, and weight gain.   Regarding her anemia she receives IV iron as needed her most recent being on 01/25/23.  In the interval since last being seen, she started taking oral iron supplements with?  Prenatal?  Integra plus?  Slow release?  She has been tolerating this ***.  Since undergoing iron infusions, her symptoms have ***fatigue?  Shortness of breath?  Lightheadedness?  Any other abnormal bleeding or bruising?  She is here  today for evaluation repeat blood work.Marland Kitchen    MEDICAL HISTORY: Past Medical History:  Diagnosis Date   Abdominal pain    Crohn's colitis (HCC)    Diarrhea    Dysmenorrhea    Seen at [facility]   IBS (irritable bowel syndrome)    IBS (irritable bowel syndrome)    Iron deficiency anemia    Thyroid disease    hyperthyroid    ALLERGIES:  is allergic to cocos nucifera, ibuprofen, reglan [metoclopramide], tylenol [acetaminophen], and haldol [haloperidol].  MEDICATIONS:  Current Outpatient Medications  Medication Sig Dispense Refill   ALAYCEN 1/35 tablet Take 1 tablet by mouth daily.     FeFum-FePoly-FA-B Cmp-C-Biot (INTEGRA PLUS) CAPS Take 1 capsule by mouth every morning. 30 capsule 2   gabapentin (NEURONTIN) 300 MG capsule Take 1 capsule by mouth for 3 days, THEN 1 capsule by mouth twice a daily for 3 days, THEN 1 capsule by mouth 3 times a day.     ketoconazole (NIZORAL) 2 % cream Apply topically.     levothyroxine (SYNTHROID) 75 MCG tablet Take by mouth.     ondansetron (ZOFRAN ODT) 8 MG disintegrating tablet Take 1 tablet (8 mg total) by mouth every 8 (eight) hours as needed for nausea or vomiting. 20 tablet 0   ondansetron (ZOFRAN-ODT) 4 MG disintegrating tablet Take 1 tablet (4 mg total) by mouth every 8 (eight) hours as needed for up to 12 doses for nausea or vomiting. 12 tablet 0   pantoprazole (PROTONIX) 40 MG tablet Take 40 mg by mouth 2 (two) times daily.  promethazine (PHENERGAN) 12.5 MG tablet Take 12.5 mg by mouth every 8 (eight) hours as needed for nausea or vomiting.     promethazine (PHENERGAN) 25 MG suppository Place 1 suppository (25 mg total) rectally every 6 (six) hours as needed for nausea or vomiting. 12 each 1   promethazine (PHENERGAN) 25 MG suppository Place 1 suppository (25 mg total) rectally every 8 (eight) hours as needed for up to 6 doses for nausea or vomiting. 6 each 0   promethazine (PHENERGAN) 25 MG tablet Take 1 tablet (25 mg total) by mouth every 8  (eight) hours as needed for up to 6 doses for nausea or vomiting. 6 tablet 0   triamcinolone cream (KENALOG) 0.1 % Apply topically.     No current facility-administered medications for this visit.    SURGICAL HISTORY:  Past Surgical History:  Procedure Laterality Date   CHOLECYSTECTOMY     TONSILLECTOMY Bilateral    WISDOM TOOTH EXTRACTION      REVIEW OF SYSTEMS:   Review of Systems  Constitutional: Negative for appetite change, chills, fatigue, fever and unexpected weight change.  HENT:   Negative for mouth sores, nosebleeds, sore throat and trouble swallowing.   Eyes: Negative for eye problems and icterus.  Respiratory: Negative for cough, hemoptysis, shortness of breath and wheezing.   Cardiovascular: Negative for chest pain and leg swelling.  Gastrointestinal: Negative for abdominal pain, constipation, diarrhea, nausea and vomiting.  Genitourinary: Negative for bladder incontinence, difficulty urinating, dysuria, frequency and hematuria.   Musculoskeletal: Negative for back pain, gait problem, neck pain and neck stiffness.  Skin: Negative for itching and rash.  Neurological: Negative for dizziness, extremity weakness, gait problem, headaches, light-headedness and seizures.  Hematological: Negative for adenopathy. Does not bruise/bleed easily.  Psychiatric/Behavioral: Negative for confusion, depression and sleep disturbance. The patient is not nervous/anxious.     PHYSICAL EXAMINATION:  There were no vitals taken for this visit.  ECOG PERFORMANCE STATUS: {CHL ONC ECOG [id number]  Physical Exam  Constitutional: Oriented to person, place, and time and well-developed, well-nourished, and in no distress. No distress.  HENT:  Head: Normocephalic and atraumatic.  Mouth/Throat: Oropharynx is clear and moist. No oropharyngeal exudate.  Eyes: Conjunctivae are normal. Right eye exhibits no discharge. Left eye exhibits no discharge. No scleral icterus.  Neck: Normal range of  motion. Neck supple.  Cardiovascular: Normal rate, regular rhythm, normal heart sounds and intact distal pulses.   Pulmonary/Chest: Effort normal and breath sounds normal. No respiratory distress. No wheezes. No rales.  Abdominal: Soft. Bowel sounds are normal. Exhibits no distension and no mass. There is no tenderness.  Musculoskeletal: Normal range of motion. Exhibits no edema.  Lymphadenopathy:    No cervical adenopathy.  Neurological: Alert and oriented to person, place, and time. Exhibits normal muscle tone. Gait normal. Coordination normal.  Skin: Skin is warm and dry. No rash noted. Not diaphoretic. No erythema. No pallor.  Psychiatric: Mood, memory and judgment normal.  Vitals reviewed.  LABORATORY DATA: Lab Results  Component Value Date   WBC 12.2 (H) 02/08/2023   HGB 14.2 02/08/2023   HCT 43.0 02/08/2023   MCV 87.8 02/08/2023   PLT 257 02/08/2023      Chemistry      Component Value Date/Time   NA 138 02/08/2023 0637   K 3.5 02/08/2023 0637   CL 106 02/08/2023 0637   CO2 21 (L) 02/08/2023 0637   BUN 6 02/08/2023 0637   CREATININE 0.81 02/08/2023 0637   CREATININE 0.79 06/28/2022 1006  Component Value Date/Time   CALCIUM 8.8 (L) 02/08/2023 0637   ALKPHOS 84 02/08/2023 0637   AST 29 02/08/2023 0637   ALT 32 02/08/2023 0637   BILITOT 0.5 02/08/2023 0637       RADIOGRAPHIC STUDIES:  No results found.   ASSESSMENT/PLAN:  This is a very pleasant 22 year old Caucasian female referred to clinic for iron deficiency anemia secondary to chronic menstrual blood loss and possible GI blood loss from Crohn's disease.   She has intolerance to iron supplements he receives IV iron as needed with 200 mg IV of Venofer her most recent being on 09/07/2022.    The patient was seen with Dr. Arbutus Ped today.  The patient repeat CBC, iron studies, ferritin drawn today.  Her labs today show normal CBC***  Her CMP is also unremarkable.   he iron studies and ferritin are still  pending at this time.  If she continues to demonstrate significant iron deficiency, then we will reach out to the patient to arrange for IV iron infusions.    Will see the patient back for follow-up visit in 3 months for evaluation repeat blood work.    She will continue taking ***  She will continue to follow with GI regarding her Crohn's disease and IBS.  She will follow with her PCP for her hypothyroidism.  She will follow with GYN for heavy menstrual cycles.  The patient was advised to call immediately if she has any concerning symptoms in the interval. The patient voices understanding of current disease status and treatment options and is in agreement with the current care plan. All questions were answered. The patient knows to call the clinic with any problems, questions or concerns. We can certainly see the patient much sooner if necessary  No orders of the defined types were placed in this encounter.    I spent {CHL ONC TIME VISIT - XBJYN:[id number]} counseling the patient face to face. The total time spent in the appointment was {CHL ONC TIME VISIT - MVHQI:[id number]}.  Alexis Shadd L Shaolin Armas, PA-C 06/07/23

## 2023-06-10 DIAGNOSIS — F1120 Opioid dependence, uncomplicated: Secondary | ICD-10-CM | POA: Diagnosis not present

## 2023-06-12 ENCOUNTER — Inpatient Hospital Stay: Payer: Medicare HMO | Attending: Physician Assistant | Admitting: Physician Assistant

## 2023-06-12 ENCOUNTER — Inpatient Hospital Stay: Payer: Medicare HMO

## 2023-06-17 DIAGNOSIS — F1120 Opioid dependence, uncomplicated: Secondary | ICD-10-CM | POA: Diagnosis not present

## 2023-07-01 DIAGNOSIS — F1120 Opioid dependence, uncomplicated: Secondary | ICD-10-CM | POA: Diagnosis not present

## 2023-07-02 DIAGNOSIS — H1045 Other chronic allergic conjunctivitis: Secondary | ICD-10-CM | POA: Diagnosis not present

## 2023-07-02 DIAGNOSIS — H52223 Regular astigmatism, bilateral: Secondary | ICD-10-CM | POA: Diagnosis not present

## 2023-07-02 DIAGNOSIS — H524 Presbyopia: Secondary | ICD-10-CM | POA: Diagnosis not present

## 2023-07-02 DIAGNOSIS — H5213 Myopia, bilateral: Secondary | ICD-10-CM | POA: Diagnosis not present

## 2023-07-08 DIAGNOSIS — F1120 Opioid dependence, uncomplicated: Secondary | ICD-10-CM | POA: Diagnosis not present

## 2023-07-15 DIAGNOSIS — F1120 Opioid dependence, uncomplicated: Secondary | ICD-10-CM | POA: Diagnosis not present

## 2023-07-22 DIAGNOSIS — F1120 Opioid dependence, uncomplicated: Secondary | ICD-10-CM | POA: Diagnosis not present

## 2023-07-29 DIAGNOSIS — F1120 Opioid dependence, uncomplicated: Secondary | ICD-10-CM | POA: Diagnosis not present

## 2023-08-05 DIAGNOSIS — R112 Nausea with vomiting, unspecified: Secondary | ICD-10-CM | POA: Diagnosis not present

## 2023-08-05 DIAGNOSIS — F1120 Opioid dependence, uncomplicated: Secondary | ICD-10-CM | POA: Diagnosis not present

## 2023-08-05 DIAGNOSIS — E039 Hypothyroidism, unspecified: Secondary | ICD-10-CM | POA: Diagnosis not present

## 2023-08-05 DIAGNOSIS — Z6841 Body Mass Index (BMI) 40.0 and over, adult: Secondary | ICD-10-CM | POA: Diagnosis not present

## 2023-08-05 DIAGNOSIS — M5442 Lumbago with sciatica, left side: Secondary | ICD-10-CM | POA: Diagnosis not present

## 2023-08-05 DIAGNOSIS — G5712 Meralgia paresthetica, left lower limb: Secondary | ICD-10-CM | POA: Diagnosis not present

## 2023-08-05 DIAGNOSIS — R1013 Epigastric pain: Secondary | ICD-10-CM | POA: Diagnosis not present

## 2023-08-12 DIAGNOSIS — F1120 Opioid dependence, uncomplicated: Secondary | ICD-10-CM | POA: Diagnosis not present

## 2023-08-13 DIAGNOSIS — M5432 Sciatica, left side: Secondary | ICD-10-CM | POA: Diagnosis not present

## 2023-08-13 DIAGNOSIS — M5431 Sciatica, right side: Secondary | ICD-10-CM | POA: Diagnosis not present

## 2023-08-13 DIAGNOSIS — M5459 Other low back pain: Secondary | ICD-10-CM | POA: Diagnosis not present

## 2023-08-19 DIAGNOSIS — F1120 Opioid dependence, uncomplicated: Secondary | ICD-10-CM | POA: Diagnosis not present

## 2023-08-22 DIAGNOSIS — Z0183 Encounter for blood typing: Secondary | ICD-10-CM | POA: Diagnosis not present

## 2023-08-26 DIAGNOSIS — F1120 Opioid dependence, uncomplicated: Secondary | ICD-10-CM | POA: Diagnosis not present

## 2023-08-28 ENCOUNTER — Encounter (HOSPITAL_COMMUNITY): Payer: Self-pay

## 2023-08-28 ENCOUNTER — Emergency Department (HOSPITAL_COMMUNITY)
Admission: EM | Admit: 2023-08-28 | Discharge: 2023-08-28 | Disposition: A | Discharge status: Home / Self Care | Payer: Medicare HMO | Attending: Emergency Medicine | Admitting: Emergency Medicine

## 2023-08-28 ENCOUNTER — Emergency Department (HOSPITAL_COMMUNITY): Payer: Medicare HMO

## 2023-08-28 DIAGNOSIS — M25551 Pain in right hip: Secondary | ICD-10-CM | POA: Insufficient documentation

## 2023-08-28 DIAGNOSIS — Y92410 Unspecified street and highway as the place of occurrence of the external cause: Secondary | ICD-10-CM | POA: Insufficient documentation

## 2023-08-28 DIAGNOSIS — R000 Tachycardia, unspecified: Secondary | ICD-10-CM | POA: Insufficient documentation

## 2023-08-28 DIAGNOSIS — S3992XA Unspecified injury of lower back, initial encounter: Secondary | ICD-10-CM | POA: Diagnosis not present

## 2023-08-28 DIAGNOSIS — M545 Low back pain, unspecified: Secondary | ICD-10-CM | POA: Diagnosis not present

## 2023-08-28 DIAGNOSIS — M4807 Spinal stenosis, lumbosacral region: Secondary | ICD-10-CM | POA: Diagnosis not present

## 2023-08-28 DIAGNOSIS — Z743 Need for continuous supervision: Secondary | ICD-10-CM | POA: Diagnosis not present

## 2023-08-28 LAB — PREGNANCY, URINE: Preg Test, Ur: NEGATIVE

## 2023-08-28 MED ORDER — DIAZEPAM 2 MG PO TABS
2.0000 mg | ORAL_TABLET | Freq: Once | ORAL | Status: AC
  Administered 2023-08-28: 2 mg via ORAL
  Filled 2023-08-28: qty 1

## 2023-08-28 MED ORDER — METHOCARBAMOL 500 MG PO TABS: 500.0000 mg | ORAL_TABLET | Freq: Two times a day (BID) | ORAL | 0 refills | Status: DC

## 2023-08-28 MED ORDER — OXYCODONE HCL 5 MG PO TABS
5.0000 mg | ORAL_TABLET | Freq: Once | ORAL | Status: AC
  Administered 2023-08-28: 5 mg via ORAL
  Filled 2023-08-28: qty 1

## 2023-08-28 MED ORDER — LIDOCAINE 5 % EX PTCH: 1.0000 | MEDICATED_PATCH | CUTANEOUS | 0 refills | Status: DC

## 2023-08-28 MED ORDER — ONDANSETRON 8 MG PO TBDP
8.0000 mg | ORAL_TABLET | Freq: Once | ORAL | Status: AC
  Administered 2023-08-28: 8 mg via ORAL
  Filled 2023-08-28: qty 1

## 2023-08-28 NOTE — Discharge Instructions (Signed)
CT scan of the low back, right hip x-ray did not show any concerning findings.  Likely muscle strain.  I have sent muscle relaxant and lidocaine patch into the pharmacy.  Follow-up with your primary care provider.  For any concerning symptoms return to the emergency room.
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
## 2023-09-04 DIAGNOSIS — M5432 Sciatica, left side: Secondary | ICD-10-CM | POA: Diagnosis not present

## 2023-09-04 DIAGNOSIS — M5459 Other low back pain: Secondary | ICD-10-CM | POA: Diagnosis not present

## 2023-09-04 DIAGNOSIS — M5431 Sciatica, right side: Secondary | ICD-10-CM | POA: Diagnosis not present

## 2023-09-06 DIAGNOSIS — M5431 Sciatica, right side: Secondary | ICD-10-CM | POA: Diagnosis not present

## 2023-09-06 DIAGNOSIS — M5459 Other low back pain: Secondary | ICD-10-CM | POA: Diagnosis not present

## 2023-09-06 DIAGNOSIS — M5432 Sciatica, left side: Secondary | ICD-10-CM | POA: Diagnosis not present

## 2023-09-10 DIAGNOSIS — M5432 Sciatica, left side: Secondary | ICD-10-CM | POA: Diagnosis not present

## 2023-09-10 DIAGNOSIS — F1120 Opioid dependence, uncomplicated: Secondary | ICD-10-CM | POA: Diagnosis not present

## 2023-09-10 DIAGNOSIS — M5459 Other low back pain: Secondary | ICD-10-CM | POA: Diagnosis not present

## 2023-09-10 DIAGNOSIS — M5431 Sciatica, right side: Secondary | ICD-10-CM | POA: Diagnosis not present

## 2023-09-12 DIAGNOSIS — M5432 Sciatica, left side: Secondary | ICD-10-CM | POA: Diagnosis not present

## 2023-09-12 DIAGNOSIS — M5459 Other low back pain: Secondary | ICD-10-CM | POA: Diagnosis not present

## 2023-09-12 DIAGNOSIS — M5431 Sciatica, right side: Secondary | ICD-10-CM | POA: Diagnosis not present

## 2023-09-16 DIAGNOSIS — F1120 Opioid dependence, uncomplicated: Secondary | ICD-10-CM | POA: Diagnosis not present

## 2023-09-17 DIAGNOSIS — M5459 Other low back pain: Secondary | ICD-10-CM | POA: Diagnosis not present

## 2023-09-17 DIAGNOSIS — M5432 Sciatica, left side: Secondary | ICD-10-CM | POA: Diagnosis not present

## 2023-09-17 DIAGNOSIS — M5431 Sciatica, right side: Secondary | ICD-10-CM | POA: Diagnosis not present

## 2023-09-20 DIAGNOSIS — M5459 Other low back pain: Secondary | ICD-10-CM | POA: Diagnosis not present

## 2023-09-20 DIAGNOSIS — M5432 Sciatica, left side: Secondary | ICD-10-CM | POA: Diagnosis not present

## 2023-09-20 DIAGNOSIS — M5431 Sciatica, right side: Secondary | ICD-10-CM | POA: Diagnosis not present

## 2023-09-23 DIAGNOSIS — F1120 Opioid dependence, uncomplicated: Secondary | ICD-10-CM | POA: Diagnosis not present

## 2023-09-25 DIAGNOSIS — M5431 Sciatica, right side: Secondary | ICD-10-CM | POA: Diagnosis not present

## 2023-09-25 DIAGNOSIS — M5432 Sciatica, left side: Secondary | ICD-10-CM | POA: Diagnosis not present

## 2023-09-25 DIAGNOSIS — M5459 Other low back pain: Secondary | ICD-10-CM | POA: Diagnosis not present

## 2023-09-27 DIAGNOSIS — M5432 Sciatica, left side: Secondary | ICD-10-CM | POA: Diagnosis not present

## 2023-09-27 DIAGNOSIS — M5459 Other low back pain: Secondary | ICD-10-CM | POA: Diagnosis not present

## 2023-09-27 DIAGNOSIS — M5431 Sciatica, right side: Secondary | ICD-10-CM | POA: Diagnosis not present

## 2023-09-30 DIAGNOSIS — F1120 Opioid dependence, uncomplicated: Secondary | ICD-10-CM | POA: Diagnosis not present

## 2023-10-04 DIAGNOSIS — M5432 Sciatica, left side: Secondary | ICD-10-CM | POA: Diagnosis not present

## 2023-10-04 DIAGNOSIS — M5431 Sciatica, right side: Secondary | ICD-10-CM | POA: Diagnosis not present

## 2023-10-04 DIAGNOSIS — M5459 Other low back pain: Secondary | ICD-10-CM | POA: Diagnosis not present

## 2023-10-08 DIAGNOSIS — F1120 Opioid dependence, uncomplicated: Secondary | ICD-10-CM | POA: Diagnosis not present

## 2023-10-14 DIAGNOSIS — F1120 Opioid dependence, uncomplicated: Secondary | ICD-10-CM | POA: Diagnosis not present

## 2023-10-15 DIAGNOSIS — M5431 Sciatica, right side: Secondary | ICD-10-CM | POA: Diagnosis not present

## 2023-10-15 DIAGNOSIS — M5432 Sciatica, left side: Secondary | ICD-10-CM | POA: Diagnosis not present

## 2023-10-15 DIAGNOSIS — M5459 Other low back pain: Secondary | ICD-10-CM | POA: Diagnosis not present

## 2023-10-18 ENCOUNTER — Telehealth: Payer: Self-pay | Admitting: Physician Assistant

## 2023-10-18 ENCOUNTER — Encounter: Payer: Self-pay | Admitting: Physician Assistant

## 2023-10-21 DIAGNOSIS — Z01419 Encounter for gynecological examination (general) (routine) without abnormal findings: Secondary | ICD-10-CM | POA: Diagnosis not present

## 2023-10-21 DIAGNOSIS — G8929 Other chronic pain: Secondary | ICD-10-CM | POA: Diagnosis not present

## 2023-10-21 DIAGNOSIS — Z1330 Encounter for screening examination for mental health and behavioral disorders, unspecified: Secondary | ICD-10-CM | POA: Diagnosis not present

## 2023-10-21 DIAGNOSIS — N898 Other specified noninflammatory disorders of vagina: Secondary | ICD-10-CM | POA: Diagnosis not present

## 2023-10-21 DIAGNOSIS — R102 Pelvic and perineal pain: Secondary | ICD-10-CM | POA: Diagnosis not present

## 2023-10-22 NOTE — Progress Notes (Deleted)
[facility] OFFICE PROGRESS NOTE  [place], [place], [name] [address] [place] [address]  DIAGNOSIS: Microcytic anemia related to iron deficiency from chronic menstrual blood losses in addition to possible GI blood losses from Crohn's disease.  She has been intolerant to oral iron replacement.   PRIOR THERAPY: None  CURRENT THERAPY: 1) IV iron with venofer 200 mg PRN, last dose on 01/25/23 2) ***check if tried integra, prenatal iron, or slow release***  INTERVAL HISTORY: Alexis Petty 23 y.o. female returnsto the clinic today for a followup of visit accompanied by her mother. She is followed by our clinic for iron deficiency anemia secondary to Crohn's disease and menstrual blood loss.  She has chronic constipation and sometimes rectal bleeding from straining. She also has some gingival bleeding with brushing her teeth.  Did you establish with dentist and GI at [facility]??? She is currently going to establish care with [facility] for her Crohn's disease and IBS.   Did you establish with gyn***She is also reestablishing with GYN regarding her heavy menstrual cycles. She states that they are currently working her up for a condition similar to endometriosis. She has heavy menstrual cycles with associated clotting every 4 weeks. She is currently on birth control to try and regulate this but she still has heavy cycles. She also has thyroid dysfunction due to hypothyroidism and has some symptoms associated with this such as hair loss, fatigue, and weight gain.   Regarding her anemia she receives IV iron as needed her most recent being on 01/25/23.  In the interval since last being seen, she started taking oral iron supplements with?  Prenatal?  Integra plus?  Slow release?  She has been tolerating this ***.  Since undergoing iron infusions, her symptoms have ***fatigue?  Shortness of breath?  Lightheadedness?  Any other abnormal bleeding or bruising?  She is here today for  evaluation repeat blood work.Marland Kitchen    MEDICAL HISTORY: Past Medical History:  Diagnosis Date   Abdominal pain    Crohn's colitis (HCC)    Diarrhea    Dysmenorrhea    Seen at [facility]   IBS (irritable bowel syndrome)    IBS (irritable bowel syndrome)    Iron deficiency anemia    Thyroid disease    hyperthyroid    ALLERGIES:  is allergic to cocos nucifera, ibuprofen, reglan [metoclopramide], tylenol [acetaminophen], and haldol [haloperidol].  MEDICATIONS:  Current Outpatient Medications  Medication Sig Dispense Refill   ALAYCEN 1/35 tablet Take 1 tablet by mouth daily.     FeFum-FePoly-FA-B Cmp-C-Biot (INTEGRA PLUS) CAPS Take 1 capsule by mouth every morning. 30 capsule 2   gabapentin (NEURONTIN) 300 MG capsule Take 1 capsule by mouth for 3 days, THEN 1 capsule by mouth twice a daily for 3 days, THEN 1 capsule by mouth 3 times a day.     ketoconazole (NIZORAL) 2 % cream Apply topically.     levothyroxine (SYNTHROID) 75 MCG tablet Take by mouth.     lidocaine (LIDODERM) 5 % Place 1 patch onto the skin daily. Remove & Discard patch within 12 hours or as directed by MD 30 patch 0   methocarbamol (ROBAXIN) 500 MG tablet Take 1 tablet (500 mg total) by mouth 2 (two) times daily. 20 tablet 0   ondansetron (ZOFRAN ODT) 8 MG disintegrating tablet Take 1 tablet (8 mg total) by mouth every 8 (eight) hours as needed for nausea or vomiting. 20 tablet 0   ondansetron (ZOFRAN-ODT) 4 MG  disintegrating tablet Take 1 tablet (4 mg total) by mouth every 8 (eight) hours as needed for up to 12 doses for nausea or vomiting. 12 tablet 0   pantoprazole (PROTONIX) 40 MG tablet Take 40 mg by mouth 2 (two) times daily.     promethazine (PHENERGAN) 12.5 MG tablet Take 12.5 mg by mouth every 8 (eight) hours as needed for nausea or vomiting.     promethazine (PHENERGAN) 25 MG suppository Place 1 suppository (25 mg total) rectally every 6 (six) hours as needed for nausea or vomiting. 12 each 1   promethazine  (PHENERGAN) 25 MG suppository Place 1 suppository (25 mg total) rectally every 8 (eight) hours as needed for up to 6 doses for nausea or vomiting. 6 each 0   promethazine (PHENERGAN) 25 MG tablet Take 1 tablet (25 mg total) by mouth every 8 (eight) hours as needed for up to 6 doses for nausea or vomiting. 6 tablet 0   triamcinolone cream (KENALOG) 0.1 % Apply topically.     No current facility-administered medications for this visit.    SURGICAL HISTORY:  Past Surgical History:  Procedure Laterality Date   CHOLECYSTECTOMY     TONSILLECTOMY Bilateral    WISDOM TOOTH EXTRACTION      REVIEW OF SYSTEMS:   Review of Systems  Constitutional: Negative for appetite change, chills, fatigue, fever and unexpected weight change.  HENT:   Negative for mouth sores, nosebleeds, sore throat and trouble swallowing.   Eyes: Negative for eye problems and icterus.  Respiratory: Negative for cough, hemoptysis, shortness of breath and wheezing.   Cardiovascular: Negative for chest pain and leg swelling.  Gastrointestinal: Negative for abdominal pain, constipation, diarrhea, nausea and vomiting.  Genitourinary: Negative for bladder incontinence, difficulty urinating, dysuria, frequency and hematuria.   Musculoskeletal: Negative for back pain, gait problem, neck pain and neck stiffness.  Skin: Negative for itching and rash.  Neurological: Negative for dizziness, extremity weakness, gait problem, headaches, light-headedness and seizures.  Hematological: Negative for adenopathy. Does not bruise/bleed easily.  Psychiatric/Behavioral: Negative for confusion, depression and sleep disturbance. The patient is not nervous/anxious.     PHYSICAL EXAMINATION:  There were no vitals taken for this visit.  ECOG PERFORMANCE STATUS: {CHL ONC ECOG [id number]  Physical Exam  Constitutional: Oriented to person, place, and time and well-developed, well-nourished, and in no distress. No distress.  HENT:  Head:  Normocephalic and atraumatic.  Mouth/Throat: Oropharynx is clear and moist. No oropharyngeal exudate.  Eyes: Conjunctivae are normal. Right eye exhibits no discharge. Left eye exhibits no discharge. No scleral icterus.  Neck: Normal range of motion. Neck supple.  Cardiovascular: Normal rate, regular rhythm, normal heart sounds and intact distal pulses.   Pulmonary/Chest: Effort normal and breath sounds normal. No respiratory distress. No wheezes. No rales.  Abdominal: Soft. Bowel sounds are normal. Exhibits no distension and no mass. There is no tenderness.  Musculoskeletal: Normal range of motion. Exhibits no edema.  Lymphadenopathy:    No cervical adenopathy.  Neurological: Alert and oriented to person, place, and time. Exhibits normal muscle tone. Gait normal. Coordination normal.  Skin: Skin is warm and dry. No rash noted. Not diaphoretic. No erythema. No pallor.  Psychiatric: Mood, memory and judgment normal.  Vitals reviewed.  LABORATORY DATA: Lab Results  Component Value Date   WBC 12.2 (H) 02/08/2023   HGB 14.2 02/08/2023   HCT 43.0 02/08/2023   MCV 87.8 02/08/2023   PLT 257 02/08/2023      Chemistry  Component Value Date/Time   NA 138 02/08/2023 0637   K 3.5 02/08/2023 0637   CL 106 02/08/2023 0637   CO2 21 (L) 02/08/2023 0637   BUN 6 02/08/2023 0637   CREATININE 0.81 02/08/2023 0637   CREATININE 0.79 06/28/2022 1006      Component Value Date/Time   CALCIUM 8.8 (L) 02/08/2023 0637   ALKPHOS 84 02/08/2023 0637   AST 29 02/08/2023 0637   ALT 32 02/08/2023 0637   BILITOT 0.5 02/08/2023 0637       RADIOGRAPHIC STUDIES:  No results found.   ASSESSMENT/PLAN:  This is a very pleasant 23 year old Caucasian female referred to clinic for iron deficiency anemia secondary to chronic menstrual blood loss and possible GI blood loss from Crohn's disease.   She has intolerance to iron supplements he receives IV iron as needed with 200 mg IV of Venofer her most  recent being on 09/07/2022.    The patient was seen with Dr. Arbutus Ped today.  The patient repeat CBC, iron studies, ferritin drawn today.  Her labs today show normal CBC***  Her CMP is also unremarkable.   he iron studies and ferritin are still pending at this time.  If she continues to demonstrate significant iron deficiency, then we will reach out to the patient to arrange for IV iron infusions.    Will see the patient back for follow-up visit in 3 months for evaluation repeat blood work.    She will continue taking ***  She will continue to follow with GI regarding her Crohn's disease and IBS.  She will follow with her PCP for her hypothyroidism.  She will follow with GYN for heavy menstrual cycles.  The patient was advised to call immediately if she has any concerning symptoms in the interval. The patient voices understanding of current disease status and treatment options and is in agreement with the current care plan. All questions were answered. The patient knows to call the clinic with any problems, questions or concerns. We can certainly see the patient much sooner if necessary   No orders of the defined types were placed in this encounter.    I spent {CHL ONC TIME VISIT - ZOXWR:[id number]} counseling the patient face to face. The total time spent in the appointment was {CHL ONC TIME VISIT - BJYNW:[id number]}.  Itai Barbian L Jaxsin Bottomley, PA-C 10/22/23

## 2023-10-25 ENCOUNTER — Inpatient Hospital Stay: Payer: 59 | Admitting: Physician Assistant

## 2023-10-25 ENCOUNTER — Inpatient Hospital Stay: Payer: 59

## 2023-10-25 ENCOUNTER — Telehealth: Payer: Self-pay | Admitting: Physician Assistant

## 2023-10-25 NOTE — Progress Notes (Signed)
[facility] OFFICE PROGRESS NOTE  [place], [place], [name] [address] [place] [address]  DIAGNOSIS: Microcytic anemia related to iron deficiency from chronic menstrual blood losses in addition to possible GI blood losses from Crohn's disease.  She has been intolerant to oral iron replacement.   PRIOR THERAPY: Unable to tolerate oral iron supplements   CURRENT THERAPY: 1) IV iron with venofer 200 mg PRN, last dose on 01/25/23   INTERVAL HISTORY: Alexis Petty 23 y.o. female returns to the clinic today for a followup of visit accompanied by her mother. She is followed by our clinic for iron deficiency anemia secondary to Crohn's disease and menstrual blood loss.  She sometimes rectal bleeding from straining. At her last appointment, she was trying to establish with either of the GI practices in town. She states that she was discharged from one practice after missing one appointment and some outstanding medical bills and the other practice reportedly told her that they do not have the ability to support patient's with Crohn's diease. She was previously seen by a digestive health practice in [place].   She is also is now established with GYN regarding her heavy menstrual cycles. Her endometriosis work up was negative. She is on birth control to regulate her periods. She believes this is helping as her menstrual periods are much lighter at this time. She last saw her Gyn 2 weeks ago. They are referring her for pelvic floor PT.     Overall, she is feeling fair today but is tired. She takes care of both of her grandparents who have dementia. She was not able to tolerate oral iron supplements. I previously suggested prescription, slow release or liquid. She sometimes has dyspnea/decreased exercise tolerance but it improves with rest. Besides occasional rectal bleeding and menstrual bleeding, she denies other abnormal bleeding. She is here today for evaluation repeat  blood work.Marland Kitchen   MEDICAL HISTORY: Past Medical History:  Diagnosis Date   Abdominal pain    Crohn's colitis (HCC)    Diarrhea    Dysmenorrhea    Seen at [facility]   IBS (irritable bowel syndrome)    IBS (irritable bowel syndrome)    Iron deficiency anemia    Thyroid disease    hyperthyroid    ALLERGIES:  is allergic to cocos nucifera, ibuprofen, reglan [metoclopramide], tylenol [acetaminophen], and haldol [haloperidol].  MEDICATIONS:  Current Outpatient Medications  Medication Sig Dispense Refill   ALAYCEN 1/35 tablet Take 1 tablet by mouth daily.     FeFum-FePoly-FA-B Cmp-C-Biot (INTEGRA PLUS) CAPS Take 1 capsule by mouth every morning. 30 capsule 2   gabapentin (NEURONTIN) 300 MG capsule Take 1 capsule by mouth for 3 days, THEN 1 capsule by mouth twice a daily for 3 days, THEN 1 capsule by mouth 3 times a day.     ketoconazole (NIZORAL) 2 % cream Apply topically.     levothyroxine (SYNTHROID) 75 MCG tablet Take by mouth.     promethazine (PHENERGAN) 12.5 MG tablet Take 12.5 mg by mouth every 8 (eight) hours as needed for nausea or vomiting.     promethazine (PHENERGAN) 25 MG suppository Place 1 suppository (25 mg total) rectally every 6 (six) hours as needed for nausea or vomiting. 12 each 1   promethazine (PHENERGAN) 25 MG tablet Take 1 tablet (25 mg total) by mouth every 8 (eight) hours as needed for up to 6 doses for nausea or vomiting. 6 tablet 0   triamcinolone cream (KENALOG) 0.1 %  Apply topically.     ondansetron (ZOFRAN ODT) 8 MG disintegrating tablet Take 1 tablet (8 mg total) by mouth every 8 (eight) hours as needed for nausea or vomiting. 20 tablet 0   ondansetron (ZOFRAN-ODT) 4 MG disintegrating tablet Take 1 tablet (4 mg total) by mouth every 8 (eight) hours as needed for up to 12 doses for nausea or vomiting. 12 tablet 0   pantoprazole (PROTONIX) 40 MG tablet Take 40 mg by mouth 2 (two) times daily.     No current facility-administered medications for this visit.     SURGICAL HISTORY:  Past Surgical History:  Procedure Laterality Date   CHOLECYSTECTOMY     TONSILLECTOMY Bilateral    WISDOM TOOTH EXTRACTION      REVIEW OF SYSTEMS:   Review of Systems  Constitutional: Positive for fatigue. Negative for appetite change, chills, fever and unexpected weight change.  HENT: Negative for mouth sores, nosebleeds, sore throat and trouble swallowing.   Eyes: Negative for eye problems and icterus.  Respiratory: Positive for shortness of breath with activity. Negative for cough, hemoptysis, and wheezing.   Cardiovascular: Negative for chest pain and leg swelling.  Gastrointestinal: Positive for occasional rectal bleeding. Negative for abdominal pain, constipation, diarrhea, nausea and vomiting.  Genitourinary: Negative for bladder incontinence, difficulty urinating, dysuria, frequency and hematuria.   Musculoskeletal: Negative for back pain, gait problem, neck pain and neck stiffness.  Skin: Negative for itching and rash.  Neurological: Negative for dizziness, extremity weakness, gait problem, headaches, light-headedness and seizures.  Hematological: Negative for adenopathy. Does not bruise/bleed easily.  Psychiatric/Behavioral: Negative for confusion, depression and sleep disturbance. The patient is not nervous/anxious.     PHYSICAL EXAMINATION:  There were no vitals taken for this visit.  ECOG PERFORMANCE STATUS: 1  Physical Exam  Constitutional: Oriented to person, place, and time and well-developed, well-nourished, and in no distress.  HENT:  Head: Normocephalic and atraumatic.  Mouth/Throat: Oropharynx is clear and moist. No oropharyngeal exudate.  Eyes: Conjunctivae are normal. Right eye exhibits no discharge. Left eye exhibits no discharge. No scleral icterus.  Neck: Normal range of motion. Neck supple.  Cardiovascular: Normal rate, regular rhythm, normal heart sounds and intact distal pulses.   Pulmonary/Chest: Effort normal and breath  sounds normal. No respiratory distress. No wheezes. No rales.  Abdominal: Soft. Bowel sounds are normal. Exhibits no distension and no mass. There is no tenderness.  Musculoskeletal: Normal range of motion. Exhibits no edema.  Lymphadenopathy:    No cervical adenopathy.  Neurological: Alert and oriented to person, place, and time. Exhibits normal muscle tone. Gait normal. Coordination normal.  Skin: Skin is warm and dry. No rash noted. Not diaphoretic. No erythema. No pallor.  Psychiatric: Mood, memory and judgment normal.  Vitals reviewed.  LABORATORY DATA: Lab Results  Component Value Date   WBC 7.4 11/01/2023   HGB 14.1 11/01/2023   HCT 41.3 11/01/2023   MCV 86.6 11/01/2023   PLT 297 11/01/2023      Chemistry      Component Value Date/Time   NA 137 11/01/2023 1017   K 4.0 11/01/2023 1017   CL 105 11/01/2023 1017   CO2 25 11/01/2023 1017   BUN 7 11/01/2023 1017   CREATININE 0.78 11/01/2023 1017   CREATININE 0.79 06/28/2022 1006      Component Value Date/Time   CALCIUM 9.1 11/01/2023 1017   ALKPHOS 62 11/01/2023 1017   AST 15 11/01/2023 1017   ALT 16 11/01/2023 1017   BILITOT 0.2 11/01/2023 1017  RADIOGRAPHIC STUDIES:  No results found.   ASSESSMENT/PLAN:  This is a very pleasant 23 year old Caucasian female referred to clinic for iron deficiency anemia secondary to chronic menstrual blood loss and possible GI blood loss from Crohn's disease.   She has intolerance to iron supplements he receives IV iron as needed with 200 mg IV of Venofer her most recent being on April 2024.    The patient was seen with Dr. Arbutus Ped today.  The patient repeat CBC, iron studies, ferritin drawn today.  Her labs today show normal CBC.   Her CMP is also unremarkable.   Her iron studies and ferritin are still pending at this time.  If she continues to demonstrate significant iron deficiency, then we will reach out to the patient to arrange for IV iron infusions.    Will see the  patient back for follow-up visit in 6 months for evaluation repeat blood work.  We discussed options for finding a gastroenterologist in network. We discussed checking her insurance companies website for a specialist in network. Once she finds one in network, would recommend her PCP place referral and send prior records for GI history.    The patient was advised to call immediately if she has any concerning symptoms in the interval. The patient voices understanding of current disease status and treatment options and is in agreement with the current care plan. All questions were answered. The patient knows to call the clinic with any problems, questions or concerns. We can certainly see the patient much sooner if necessary   Orders Placed This Encounter  Procedures   CBC with Differential (Cancer Center Only)    Standing Status:   Future    Expected Date:   04/30/2024    Expiration Date:   10/31/2024   Ferritin    Standing Status:   Future    Expected Date:   04/30/2024    Expiration Date:   10/31/2024   Iron and Iron Binding Capacity ([facility] only)    Standing Status:   Future    Expected Date:   04/30/2024    Expiration Date:   10/31/2024     The total time spent in the appointment was 20-29 minutes  Ginette Bradway L Tan Clopper, PA-C 11/01/23

## 2023-10-28 DIAGNOSIS — F1120 Opioid dependence, uncomplicated: Secondary | ICD-10-CM | POA: Diagnosis not present

## 2023-10-31 DIAGNOSIS — M5459 Other low back pain: Secondary | ICD-10-CM | POA: Diagnosis not present

## 2023-10-31 DIAGNOSIS — M5431 Sciatica, right side: Secondary | ICD-10-CM | POA: Diagnosis not present

## 2023-10-31 DIAGNOSIS — M5432 Sciatica, left side: Secondary | ICD-10-CM | POA: Diagnosis not present

## 2023-11-01 ENCOUNTER — Inpatient Hospital Stay: Payer: Medicare HMO | Attending: Internal Medicine

## 2023-11-01 ENCOUNTER — Inpatient Hospital Stay: Payer: Medicare HMO | Admitting: Physician Assistant

## 2023-11-01 ENCOUNTER — Encounter: Payer: Self-pay | Admitting: Physician Assistant

## 2023-11-01 DIAGNOSIS — Z886 Allergy status to analgesic agent status: Secondary | ICD-10-CM | POA: Insufficient documentation

## 2023-11-01 DIAGNOSIS — Z793 Long term (current) use of hormonal contraceptives: Secondary | ICD-10-CM | POA: Diagnosis not present

## 2023-11-01 DIAGNOSIS — R0600 Dyspnea, unspecified: Secondary | ICD-10-CM | POA: Insufficient documentation

## 2023-11-01 DIAGNOSIS — Z9089 Acquired absence of other organs: Secondary | ICD-10-CM | POA: Diagnosis not present

## 2023-11-01 DIAGNOSIS — R5383 Other fatigue: Secondary | ICD-10-CM | POA: Insufficient documentation

## 2023-11-01 DIAGNOSIS — R0602 Shortness of breath: Secondary | ICD-10-CM | POA: Insufficient documentation

## 2023-11-01 DIAGNOSIS — D500 Iron deficiency anemia secondary to blood loss (chronic): Secondary | ICD-10-CM | POA: Diagnosis not present

## 2023-11-01 DIAGNOSIS — Z9049 Acquired absence of other specified parts of digestive tract: Secondary | ICD-10-CM | POA: Diagnosis not present

## 2023-11-01 DIAGNOSIS — K625 Hemorrhage of anus and rectum: Secondary | ICD-10-CM | POA: Insufficient documentation

## 2023-11-01 DIAGNOSIS — N920 Excessive and frequent menstruation with regular cycle: Secondary | ICD-10-CM | POA: Insufficient documentation

## 2023-11-01 DIAGNOSIS — Z79899 Other long term (current) drug therapy: Secondary | ICD-10-CM | POA: Diagnosis not present

## 2023-11-01 DIAGNOSIS — K5090 Crohn's disease, unspecified, without complications: Secondary | ICD-10-CM | POA: Diagnosis not present

## 2023-11-01 DIAGNOSIS — Z888 Allergy status to other drugs, medicaments and biological substances status: Secondary | ICD-10-CM | POA: Diagnosis not present

## 2023-11-01 LAB — CBC WITH DIFFERENTIAL/PLATELET
Abs Immature Granulocytes: 0.01 10*3/uL (ref 0.00–0.07)
Basophils Absolute: 0 10*3/uL (ref 0.0–0.1)
Basophils Relative: 1 %
Eosinophils Absolute: 0.1 10*3/uL (ref 0.0–0.5)
Eosinophils Relative: 2 %
HCT: 41.3 % (ref 36.0–46.0)
Hemoglobin: 14.1 g/dL (ref 12.0–15.0)
Immature Granulocytes: 0 %
Lymphocytes Relative: 42 %
Lymphs Abs: 3.1 10*3/uL (ref 0.7–4.0)
MCH: 29.6 pg (ref 26.0–34.0)
MCHC: 34.1 g/dL (ref 30.0–36.0)
MCV: 86.6 fL (ref 80.0–100.0)
Monocytes Absolute: 0.4 10*3/uL (ref 0.1–1.0)
Monocytes Relative: 5 %
Neutro Abs: 3.8 10*3/uL (ref 1.7–7.7)
Neutrophils Relative %: 50 %
Platelets: 297 10*3/uL (ref 150–400)
RBC: 4.77 MIL/uL (ref 3.87–5.11)
RDW: 12.3 % (ref 11.5–15.5)
WBC: 7.4 10*3/uL (ref 4.0–10.5)
nRBC: 0 % (ref 0.0–0.2)

## 2023-11-01 LAB — IRON AND IRON BINDING CAPACITY (CC-WL,HP ONLY)
Iron: 79 ug/dL (ref 28–170)
Saturation Ratios: 17 % (ref 10.4–31.8)
TIBC: 475 ug/dL — ABNORMAL HIGH (ref 250–450)
UIBC: 396 ug/dL (ref 148–442)

## 2023-11-01 LAB — COMPREHENSIVE METABOLIC PANEL
ALT: 16 U/L (ref 0–44)
AST: 15 U/L (ref 15–41)
Albumin: 3.9 g/dL (ref 3.5–5.0)
Alkaline Phosphatase: 62 U/L (ref 38–126)
Anion gap: 7 (ref 5–15)
BUN: 7 mg/dL (ref 6–20)
CO2: 25 mmol/L (ref 22–32)
Calcium: 9.1 mg/dL (ref 8.9–10.3)
Chloride: 105 mmol/L (ref 98–111)
Creatinine, Ser: 0.78 mg/dL (ref 0.44–1.00)
GFR, Estimated: >60 mL/min (ref >60)
Glucose, Bld: 122 mg/dL — ABNORMAL HIGH (ref 70–99)
Potassium: 4 mmol/L (ref 3.5–5.1)
Sodium: 137 mmol/L (ref 135–145)
Total Bilirubin: 0.2 mg/dL (ref 0.0–1.2)
Total Protein: 7.1 g/dL (ref 6.5–8.1)

## 2023-11-01 LAB — FERRITIN: Ferritin: 84 ng/mL (ref 11–307)

## 2023-11-01 NOTE — Progress Notes (Signed)
err

## 2023-11-04 DIAGNOSIS — F1120 Opioid dependence, uncomplicated: Secondary | ICD-10-CM | POA: Diagnosis not present

## 2023-11-11 DIAGNOSIS — F1120 Opioid dependence, uncomplicated: Secondary | ICD-10-CM | POA: Diagnosis not present

## 2023-11-22 DIAGNOSIS — M5432 Sciatica, left side: Secondary | ICD-10-CM | POA: Diagnosis not present

## 2023-11-22 DIAGNOSIS — M5431 Sciatica, right side: Secondary | ICD-10-CM | POA: Diagnosis not present

## 2023-11-22 DIAGNOSIS — M5459 Other low back pain: Secondary | ICD-10-CM | POA: Diagnosis not present

## 2023-11-25 DIAGNOSIS — F1120 Opioid dependence, uncomplicated: Secondary | ICD-10-CM | POA: Diagnosis not present

## 2023-12-02 DIAGNOSIS — F1120 Opioid dependence, uncomplicated: Secondary | ICD-10-CM | POA: Diagnosis not present

## 2023-12-09 DIAGNOSIS — F1120 Opioid dependence, uncomplicated: Secondary | ICD-10-CM | POA: Diagnosis not present

## 2023-12-13 DIAGNOSIS — M5432 Sciatica, left side: Secondary | ICD-10-CM | POA: Diagnosis not present

## 2023-12-13 DIAGNOSIS — M5431 Sciatica, right side: Secondary | ICD-10-CM | POA: Diagnosis not present

## 2023-12-13 DIAGNOSIS — M5459 Other low back pain: Secondary | ICD-10-CM | POA: Diagnosis not present

## 2023-12-16 DIAGNOSIS — F1120 Opioid dependence, uncomplicated: Secondary | ICD-10-CM | POA: Diagnosis not present

## 2023-12-23 DIAGNOSIS — F1120 Opioid dependence, uncomplicated: Secondary | ICD-10-CM | POA: Diagnosis not present

## 2023-12-28 ENCOUNTER — Emergency Department (HOSPITAL_COMMUNITY)

## 2023-12-28 ENCOUNTER — Other Ambulatory Visit: Payer: Self-pay

## 2023-12-28 ENCOUNTER — Encounter (HOSPITAL_COMMUNITY): Payer: Self-pay | Admitting: Emergency Medicine

## 2023-12-28 ENCOUNTER — Emergency Department (HOSPITAL_COMMUNITY)
Admission: EM | Admit: 2023-12-28 | Discharge: 2023-12-28 | Disposition: A | Discharge status: Home / Self Care | Attending: Emergency Medicine | Admitting: Emergency Medicine

## 2023-12-28 DIAGNOSIS — Z9049 Acquired absence of other specified parts of digestive tract: Secondary | ICD-10-CM | POA: Diagnosis not present

## 2023-12-28 DIAGNOSIS — R1010 Upper abdominal pain, unspecified: Secondary | ICD-10-CM | POA: Diagnosis not present

## 2023-12-28 DIAGNOSIS — R112 Nausea with vomiting, unspecified: Secondary | ICD-10-CM | POA: Diagnosis not present

## 2023-12-28 DIAGNOSIS — R059 Cough, unspecified: Secondary | ICD-10-CM | POA: Diagnosis not present

## 2023-12-28 DIAGNOSIS — R109 Unspecified abdominal pain: Secondary | ICD-10-CM | POA: Diagnosis not present

## 2023-12-28 DIAGNOSIS — R1111 Vomiting without nausea: Secondary | ICD-10-CM | POA: Diagnosis not present

## 2023-12-28 DIAGNOSIS — R1013 Epigastric pain: Secondary | ICD-10-CM | POA: Diagnosis not present

## 2023-12-28 DIAGNOSIS — I10 Essential (primary) hypertension: Secondary | ICD-10-CM | POA: Diagnosis not present

## 2023-12-28 DIAGNOSIS — R110 Nausea: Secondary | ICD-10-CM | POA: Diagnosis not present

## 2023-12-28 DIAGNOSIS — R918 Other nonspecific abnormal finding of lung field: Secondary | ICD-10-CM | POA: Diagnosis not present

## 2023-12-28 LAB — COMPREHENSIVE METABOLIC PANEL
ALT: 34 U/L (ref 0–44)
AST: 27 U/L (ref 15–41)
Albumin: 3.6 g/dL (ref 3.5–5.0)
Alkaline Phosphatase: 68 U/L (ref 38–126)
Anion gap: 9 (ref 5–15)
BUN: 10 mg/dL (ref 6–20)
CO2: 23 mmol/L (ref 22–32)
Calcium: 9.1 mg/dL (ref 8.9–10.3)
Chloride: 104 mmol/L (ref 98–111)
Creatinine, Ser: 0.73 mg/dL (ref 0.44–1.00)
GFR, Estimated: >60 mL/min (ref >60)
Glucose, Bld: 99 mg/dL (ref 70–99)
Potassium: 4.1 mmol/L (ref 3.5–5.1)
Sodium: 136 mmol/L (ref 135–145)
Total Bilirubin: 0.5 mg/dL (ref 0.0–1.2)
Total Protein: 7.5 g/dL (ref 6.5–8.1)

## 2023-12-28 LAB — CBC WITH DIFFERENTIAL/PLATELET
Abs Immature Granulocytes: 0.04 10*3/uL (ref 0.00–0.07)
Basophils Absolute: 0 10*3/uL (ref 0.0–0.1)
Basophils Relative: 0 %
Eosinophils Absolute: 0 10*3/uL (ref 0.0–0.5)
Eosinophils Relative: 0 %
HCT: 45.9 % (ref 36.0–46.0)
Hemoglobin: 14.8 g/dL (ref 12.0–15.0)
Immature Granulocytes: 0 %
Lymphocytes Relative: 10 %
Lymphs Abs: 1.2 10*3/uL (ref 0.7–4.0)
MCH: 29.7 pg (ref 26.0–34.0)
MCHC: 32.2 g/dL (ref 30.0–36.0)
MCV: 92.2 fL (ref 80.0–100.0)
Monocytes Absolute: 0.3 10*3/uL (ref 0.1–1.0)
Monocytes Relative: 3 %
Neutro Abs: 10 10*3/uL — ABNORMAL HIGH (ref 1.7–7.7)
Neutrophils Relative %: 87 %
Platelets: 304 10*3/uL (ref 150–400)
RBC: 4.98 MIL/uL (ref 3.87–5.11)
RDW: 12.6 % (ref 11.5–15.5)
WBC: 11.6 10*3/uL — ABNORMAL HIGH (ref 4.0–10.5)
nRBC: 0 % (ref 0.0–0.2)

## 2023-12-28 LAB — URINALYSIS, ROUTINE W REFLEX MICROSCOPIC
Bilirubin Urine: NEGATIVE
Glucose, UA: NEGATIVE mg/dL
Hgb urine dipstick: NEGATIVE
Ketones, ur: NEGATIVE mg/dL
Nitrite: NEGATIVE
Protein, ur: NEGATIVE mg/dL
Specific Gravity, Urine: 1.025 (ref 1.005–1.030)
pH: 7 (ref 5.0–8.0)

## 2023-12-28 LAB — HCG, SERUM, QUALITATIVE: Preg, Serum: NEGATIVE

## 2023-12-28 LAB — LIPASE, BLOOD: Lipase: 24 U/L (ref 11–51)

## 2023-12-28 MED ORDER — SODIUM CHLORIDE 0.9 % IV SOLN
12.5000 mg | Freq: Once | INTRAVENOUS | Status: AC
  Administered 2023-12-28: 12.5 mg via INTRAVENOUS
  Filled 2023-12-28: qty 12.5

## 2023-12-28 MED ORDER — MORPHINE SULFATE (PF) 4 MG/ML IV SOLN
4.0000 mg | Freq: Once | INTRAVENOUS | Status: AC
  Administered 2023-12-28: 4 mg via INTRAVENOUS
  Filled 2023-12-28: qty 1

## 2023-12-28 MED ORDER — SODIUM CHLORIDE 0.9 % IV BOLUS
1000.0000 mL | Freq: Once | INTRAVENOUS | Status: AC
  Administered 2023-12-28: 1000 mL via INTRAVENOUS

## 2023-12-28 MED ORDER — PROMETHAZINE HCL 25 MG RE SUPP: 25.0000 mg | Freq: Four times a day (QID) | RECTAL | 0 refills | Status: DC | PRN

## 2023-12-28 MED ORDER — ONDANSETRON HCL 4 MG/2ML IJ SOLN
4.0000 mg | Freq: Once | INTRAMUSCULAR | Status: AC
  Administered 2023-12-28: 4 mg via INTRAVENOUS
  Filled 2023-12-28: qty 2

## 2023-12-28 MED ORDER — IOHEXOL 300 MG/ML  SOLN
100.0000 mL | Freq: Once | INTRAMUSCULAR | Status: AC | PRN
  Administered 2023-12-28: 100 mL via INTRAVENOUS

## 2023-12-28 NOTE — ED Provider Notes (Signed)
 [place] EMERGENCY DEPARTMENT AT [facility] Provider Note   CSN: [id number] Arrival date & time: 12/28/23  1847     History  Chief Complaint  Patient presents with   Abdominal Pain    Alexis Petty is a 23 y.o. female.   Abdominal Pain    Patient has history of abdominal pain diarrhea irritable bowel syndrome Crohn's disease.  She presents to the ED for evaluation of abdominal pain.  Patient states she started having pain in her upper abdomen today.  She felt nauseated and tried to vomit thinking it might make her feel better but it did not help.  Patient states she has not had any diarrhea.  No blood in her urine.  No dysuria.  Patient has had a prior cholecystectomy.  She has not had any abdominal surgery.  Home Medications Prior to Admission medications   Medication Sig Start Date End Date Taking? Authorizing Provider  ALAYCEN 1/35 tablet Take 1 tablet by mouth daily. 08/19/20  Yes [provider]  cyclobenzaprine (FLEXERIL) 10 MG tablet Take 10 mg by mouth 3 (three) times daily.   Yes [provider]  gabapentin (NEURONTIN) 600 MG tablet Take 600 mg by mouth 3 (three) times daily.   Yes [provider]  ketoconazole (NIZORAL) 2 % cream Apply topically. 06/14/22  Yes [provider]  levothyroxine (SYNTHROID) 75 MCG tablet Take by mouth. 07/12/22  Yes [provider]  ondansetron (ZOFRAN ODT) 8 MG disintegrating tablet Take 1 tablet (8 mg total) by mouth every 8 (eight) hours as needed for nausea or vomiting. 06/23/21  Yes Lorre Nick, MD  pantoprazole (PROTONIX) 40 MG tablet Take 40 mg by mouth 2 (two) times daily. 08/24/20  Yes [provider]  promethazine (PHENERGAN) 12.5 MG tablet Take 12.5 mg by mouth every 8 (eight) hours as needed for nausea or vomiting. 02/22/21  Yes [provider]  promethazine (PHENERGAN) 25 MG suppository Place 1 suppository (25 mg total) rectally every 6 (six) hours as  needed for nausea or vomiting. 12/28/23  Yes Linwood Dibbles, MD  predniSONE (STERAPRED UNI-PAK 21 TAB) 10 MG (21) TBPK tablet Take by mouth as directed. Patient not taking: Reported on 12/28/2023 10/10/23   [provider]      Allergies    Cocos nucifera, Ibuprofen, Reglan [metoclopramide], Tylenol [acetaminophen], and Haldol [haloperidol]    Review of Systems   Review of Systems  Gastrointestinal:  Positive for abdominal pain.    Physical Exam Updated Vital Signs BP (!) 159/97 (BP Location: Left Arm)   Pulse (!) 103   Temp 98.5 F (36.9 C) (Oral)   Resp 20   LMP 12/02/2023   SpO2 98%  Physical Exam Vitals and nursing note reviewed.  Constitutional:      General: She is not in acute distress.    Appearance: She is well-developed.  HENT:     Head: Normocephalic and atraumatic.     Right Ear: External ear normal.     Left Ear: External ear normal.  Eyes:     General: No scleral icterus.       Right eye: No discharge.        Left eye: No discharge.     Conjunctiva/sclera: Conjunctivae normal.  Neck:     Trachea: No tracheal deviation.  Cardiovascular:     Rate and Rhythm: Normal rate and regular rhythm.  Pulmonary:     Effort: Pulmonary effort is normal. No respiratory distress.     Breath  sounds: Normal breath sounds. No stridor. No wheezing or rales.  Abdominal:     General: Bowel sounds are normal. There is no distension.     Palpations: Abdomen is soft.     Tenderness: There is abdominal tenderness in the epigastric area. There is no guarding or rebound.  Musculoskeletal:        General: No tenderness or deformity.     Cervical back: Neck supple.  Skin:    General: Skin is warm and dry.     Findings: No rash.  Neurological:     General: No focal deficit present.     Mental Status: She is alert.     Cranial Nerves: No cranial nerve deficit, dysarthria or facial asymmetry.     Sensory: No sensory deficit.     Motor: No abnormal muscle tone or seizure  activity.     Coordination: Coordination normal.  Psychiatric:        Mood and Affect: Mood normal.     ED Results / Procedures / Treatments   Labs (all labs ordered are listed, but only abnormal results are displayed) Labs Reviewed  CBC WITH DIFFERENTIAL/PLATELET - Abnormal; Notable for the following components:      Result Value   WBC 11.6 (*)    Neutro Abs 10.0 (*)    All other components within normal limits  URINALYSIS, ROUTINE W REFLEX MICROSCOPIC - Abnormal; Notable for the following components:   Leukocytes,Ua SMALL (*)    Bacteria, UA RARE (*)    All other components within normal limits  COMPREHENSIVE METABOLIC PANEL  LIPASE, BLOOD  HCG, SERUM, QUALITATIVE    EKG None  Radiology CT ABDOMEN PELVIS W CONTRAST Result Date: 12/28/2023 CLINICAL DATA:  Epigastric pain EXAM: CT ABDOMEN AND PELVIS WITH CONTRAST TECHNIQUE: Multidetector CT imaging of the abdomen and pelvis was performed using the standard protocol following bolus administration of intravenous contrast. RADIATION DOSE REDUCTION: This exam was performed according to the departmental dose-optimization program which includes automated exposure control, adjustment of the mA and/or kV according to patient size and/or use of iterative reconstruction technique. CONTRAST:  OMNIPAQUE IOHEXOL 300 MG/ML  SOLN COMPARISON:  CT 02/08/2023 FINDINGS: Lower chest: Lung bases demonstrate no acute airspace disease. Hepatobiliary: No focal liver abnormality is seen. Status post cholecystectomy. No biliary dilatation. Pancreas: Unremarkable. No pancreatic ductal dilatation or surrounding inflammatory changes. Spleen: Normal in size without focal abnormality. Adrenals/Urinary Tract: Adrenal glands are unremarkable. Kidneys are normal, without renal calculi, focal lesion, or hydronephrosis. Bladder is unremarkable. Stomach/Bowel: Stomach is within normal limits. Appendix appears normal. No evidence of bowel wall thickening, distention,  or inflammatory changes. Vascular/Lymphatic: No significant vascular findings are present. No enlarged abdominal or pelvic lymph nodes. Reproductive: Uterus and bilateral adnexa are unremarkable. Other: No abdominal wall hernia or abnormality. No abdominopelvic ascites. Musculoskeletal: No acute or significant osseous findings. IMPRESSION: 1. No CT evidence for acute intra-abdominal or pelvic abnormality Electronically Signed   By: Jasmine Pang M.D.   On: 12/28/2023 21:35   DG Chest 2 View Result Date: 12/28/2023 CLINICAL DATA:  Abdominal pain and cough.  Nausea and vomiting. EXAM: CHEST - 2 VIEW COMPARISON:  02/27/2012 FINDINGS: Airway thickening is present, suggesting bronchitis or reactive airways disease. No airspace opacity noted. Cardiac and mediastinal margins appear normal. No blunting of the costophrenic angles. No significant skeletal abnormality observed. IMPRESSION: 1. Airway thickening is present, suggesting bronchitis or reactive airways disease. Electronically Signed   By: Gaylyn Rong M.D.   On: 12/28/2023  20:18    Procedures Procedures    Medications Ordered in ED Medications  promethazine (PHENERGAN) 12.5 mg in sodium chloride 0.9 % 50 mL IVPB (has no administration in time range)  ondansetron (ZOFRAN) injection 4 mg (4 mg Intravenous Given 12/28/23 2008)  morphine (PF) 4 MG/ML injection 4 mg (4 mg Intravenous Given 12/28/23 2009)  sodium chloride 0.9 % bolus 1,000 mL (1,000 mLs Intravenous New Bag/Given 12/28/23 2009)  iohexol (OMNIPAQUE) 300 MG/ML solution 100 mL (100 mLs Intravenous Contrast Given 12/28/23 2114)    ED Course/ Medical Decision Making/ A&P Clinical Course as of 12/28/23 2206  Sat Dec 28, 2023  2055 CBC with Diff(!) CBC shows leukocytosis.  Metabolic panel normal.  Lipase normal.  Urinalysis not suggestive of infection [JK]  2055 Chest x-ray shows airway thickening possible bronchitis or reactive airway disease.  No pneumonia [JK]  2148 CT scan does not  show any acute abnormality [JK]    Clinical Course User Index [JK] Linwood Dibbles, MD                                 Medical Decision Making Problems Addressed: Pain of upper abdomen: acute illness or injury that poses a threat to life or bodily functions  Amount and/or Complexity of Data Reviewed Labs: ordered. Decision-making details documented in ED Course. Radiology: ordered.  Risk Prescription drug management.   Patient presented to the ED for evaluation abdominal pain.  Patient states she started having pain in her upper abdomen.  She does have history of colitis.  The ED patient was treated with pain medications as well as antiemetics.  She was treated with IV fluids.  No recurrent vomiting here in the ED.  Laboratory tests are otherwise unremarkable.  No signs of hepatitis or pancreatitis.  CT scan does not show any acute abnormality.  Symptoms improved with treatment.  Evaluation and diagnostic testing in the emergency department does not suggest an emergent condition requiring admission or immediate intervention beyond what has been performed at this time.  The patient is safe for discharge and has been instructed to return immediately for worsening symptoms, change in symptoms or any other concerns.        Final Clinical Impression(s) / ED Diagnoses Final diagnoses:  Pain of upper abdomen    Rx / DC Orders ED Discharge Orders          Ordered    promethazine (PHENERGAN) 25 MG suppository  Every 6 hours PRN        12/28/23 2200              Linwood Dibbles, MD 12/28/23 2206

## 2023-12-28 NOTE — Discharge Instructions (Signed)
 The laboratory tests and CT scan today were reassuring.  You can take the Phenergan tablets or suppositories as needed for nausea and vomiting.  Follow-up with your primary care doctor or GI doctor to be rechecked if the symptoms persist

## 2023-12-28 NOTE — ED Triage Notes (Signed)
 Patient presents with epigastric pain that improves when sitting up. Patient has tried to force herself to vomit to relieve the pain as well.    HX: Chron's   EMS vitals: 170/90 BP 110 P 98% SPO2 on room air 97 CBG

## 2023-12-30 DIAGNOSIS — F1120 Opioid dependence, uncomplicated: Secondary | ICD-10-CM | POA: Diagnosis not present

## 2024-01-03 DIAGNOSIS — K5900 Constipation, unspecified: Secondary | ICD-10-CM | POA: Diagnosis not present

## 2024-01-03 DIAGNOSIS — R1310 Dysphagia, unspecified: Secondary | ICD-10-CM | POA: Diagnosis not present

## 2024-01-03 DIAGNOSIS — R1013 Epigastric pain: Secondary | ICD-10-CM | POA: Diagnosis not present

## 2024-01-03 DIAGNOSIS — D509 Iron deficiency anemia, unspecified: Secondary | ICD-10-CM | POA: Diagnosis not present

## 2024-01-03 DIAGNOSIS — R112 Nausea with vomiting, unspecified: Secondary | ICD-10-CM | POA: Diagnosis not present

## 2024-01-06 DIAGNOSIS — F1120 Opioid dependence, uncomplicated: Secondary | ICD-10-CM | POA: Diagnosis not present

## 2024-01-13 DIAGNOSIS — F1120 Opioid dependence, uncomplicated: Secondary | ICD-10-CM | POA: Diagnosis not present

## 2024-01-20 DIAGNOSIS — N941 Unspecified dyspareunia: Secondary | ICD-10-CM | POA: Diagnosis not present

## 2024-01-20 DIAGNOSIS — Z3009 Encounter for other general counseling and advice on contraception: Secondary | ICD-10-CM | POA: Diagnosis not present

## 2024-01-20 DIAGNOSIS — G8929 Other chronic pain: Secondary | ICD-10-CM | POA: Diagnosis not present

## 2024-01-20 DIAGNOSIS — F1120 Opioid dependence, uncomplicated: Secondary | ICD-10-CM | POA: Diagnosis not present

## 2024-01-20 DIAGNOSIS — R102 Pelvic and perineal pain: Secondary | ICD-10-CM | POA: Diagnosis not present

## 2024-01-27 DIAGNOSIS — F1120 Opioid dependence, uncomplicated: Secondary | ICD-10-CM | POA: Diagnosis not present

## 2024-01-30 DIAGNOSIS — R1319 Other dysphagia: Secondary | ICD-10-CM | POA: Diagnosis not present

## 2024-01-30 DIAGNOSIS — K3189 Other diseases of stomach and duodenum: Secondary | ICD-10-CM | POA: Diagnosis not present

## 2024-01-30 DIAGNOSIS — E079 Disorder of thyroid, unspecified: Secondary | ICD-10-CM | POA: Diagnosis not present

## 2024-01-30 DIAGNOSIS — R1084 Generalized abdominal pain: Secondary | ICD-10-CM | POA: Diagnosis not present

## 2024-01-30 DIAGNOSIS — D509 Iron deficiency anemia, unspecified: Secondary | ICD-10-CM | POA: Diagnosis not present

## 2024-02-04 DIAGNOSIS — G4700 Insomnia, unspecified: Secondary | ICD-10-CM | POA: Diagnosis not present

## 2024-02-04 DIAGNOSIS — F418 Other specified anxiety disorders: Secondary | ICD-10-CM | POA: Diagnosis not present

## 2024-02-10 DIAGNOSIS — F1120 Opioid dependence, uncomplicated: Secondary | ICD-10-CM | POA: Diagnosis not present

## 2024-02-17 DIAGNOSIS — F1120 Opioid dependence, uncomplicated: Secondary | ICD-10-CM | POA: Diagnosis not present

## 2024-02-24 DIAGNOSIS — F1120 Opioid dependence, uncomplicated: Secondary | ICD-10-CM | POA: Diagnosis not present

## 2024-03-02 DIAGNOSIS — F1120 Opioid dependence, uncomplicated: Secondary | ICD-10-CM | POA: Diagnosis not present

## 2024-03-13 DIAGNOSIS — R112 Nausea with vomiting, unspecified: Secondary | ICD-10-CM | POA: Diagnosis not present

## 2024-03-13 DIAGNOSIS — R1084 Generalized abdominal pain: Secondary | ICD-10-CM | POA: Diagnosis not present

## 2024-03-13 DIAGNOSIS — K5909 Other constipation: Secondary | ICD-10-CM | POA: Diagnosis not present

## 2024-04-13 ENCOUNTER — Encounter (HOSPITAL_COMMUNITY): Payer: Self-pay

## 2024-04-13 ENCOUNTER — Emergency Department (HOSPITAL_COMMUNITY)
Admission: EM | Admit: 2024-04-13 | Discharge: 2024-04-14 | Disposition: A | Discharge status: Home / Self Care | Attending: Emergency Medicine | Admitting: Emergency Medicine

## 2024-04-13 ENCOUNTER — Other Ambulatory Visit: Payer: Self-pay

## 2024-04-13 ENCOUNTER — Emergency Department (HOSPITAL_COMMUNITY)

## 2024-04-13 DIAGNOSIS — E871 Hypo-osmolality and hyponatremia: Secondary | ICD-10-CM | POA: Insufficient documentation

## 2024-04-13 DIAGNOSIS — R112 Nausea with vomiting, unspecified: Secondary | ICD-10-CM | POA: Diagnosis not present

## 2024-04-13 DIAGNOSIS — D72829 Elevated white blood cell count, unspecified: Secondary | ICD-10-CM | POA: Diagnosis not present

## 2024-04-13 DIAGNOSIS — K529 Noninfective gastroenteritis and colitis, unspecified: Secondary | ICD-10-CM | POA: Diagnosis not present

## 2024-04-13 DIAGNOSIS — R1111 Vomiting without nausea: Secondary | ICD-10-CM | POA: Diagnosis not present

## 2024-04-13 DIAGNOSIS — N200 Calculus of kidney: Secondary | ICD-10-CM | POA: Diagnosis not present

## 2024-04-13 DIAGNOSIS — K921 Melena: Secondary | ICD-10-CM | POA: Diagnosis not present

## 2024-04-13 DIAGNOSIS — R110 Nausea: Secondary | ICD-10-CM | POA: Diagnosis not present

## 2024-04-13 DIAGNOSIS — R1013 Epigastric pain: Secondary | ICD-10-CM | POA: Diagnosis not present

## 2024-04-13 DIAGNOSIS — R109 Unspecified abdominal pain: Secondary | ICD-10-CM | POA: Diagnosis not present

## 2024-04-13 LAB — CBC WITH DIFFERENTIAL/PLATELET
Abs Immature Granulocytes: 0.09 K/uL — ABNORMAL HIGH (ref 0.00–0.07)
Basophils Absolute: 0.1 K/uL (ref 0.0–0.1)
Basophils Relative: 0 %
Eosinophils Absolute: 0.1 K/uL (ref 0.0–0.5)
Eosinophils Relative: 1 %
HCT: 47.8 % — ABNORMAL HIGH (ref 36.0–46.0)
Hemoglobin: 15.5 g/dL — ABNORMAL HIGH (ref 12.0–15.0)
Immature Granulocytes: 0 %
Lymphocytes Relative: 10 %
Lymphs Abs: 2 K/uL (ref 0.7–4.0)
MCH: 29.3 pg (ref 26.0–34.0)
MCHC: 32.4 g/dL (ref 30.0–36.0)
MCV: 90.4 fL (ref 80.0–100.0)
Monocytes Absolute: 0.9 K/uL (ref 0.1–1.0)
Monocytes Relative: 4 %
Neutro Abs: 17.5 K/uL — ABNORMAL HIGH (ref 1.7–7.7)
Neutrophils Relative %: 85 %
Platelets: 368 K/uL (ref 150–400)
RBC: 5.29 MIL/uL — ABNORMAL HIGH (ref 3.87–5.11)
RDW: 12.8 % (ref 11.5–15.5)
WBC: 20.7 K/uL — ABNORMAL HIGH (ref 4.0–10.5)
nRBC: 0 % (ref 0.0–0.2)

## 2024-04-13 LAB — COMPREHENSIVE METABOLIC PANEL WITH GFR
ALT: 45 U/L — ABNORMAL HIGH (ref 0–44)
AST: 41 U/L (ref 15–41)
Albumin: 3.8 g/dL (ref 3.5–5.0)
Alkaline Phosphatase: 62 U/L (ref 38–126)
Anion gap: 11 (ref 5–15)
BUN: 9 mg/dL (ref 6–20)
CO2: 20 mmol/L — ABNORMAL LOW (ref 22–32)
Calcium: 9.1 mg/dL (ref 8.9–10.3)
Chloride: 103 mmol/L (ref 98–111)
Creatinine, Ser: 0.81 mg/dL (ref 0.44–1.00)
GFR, Estimated: >60 mL/min (ref >60)
Glucose, Bld: 117 mg/dL — ABNORMAL HIGH (ref 70–99)
Potassium: 3.8 mmol/L (ref 3.5–5.1)
Sodium: 134 mmol/L — ABNORMAL LOW (ref 135–145)
Total Bilirubin: 0.6 mg/dL (ref 0.0–1.2)
Total Protein: 7.6 g/dL (ref 6.5–8.1)

## 2024-04-13 LAB — HCG, SERUM, QUALITATIVE: Preg, Serum: NEGATIVE

## 2024-04-13 LAB — LIPASE, BLOOD: Lipase: 29 U/L (ref 11–51)

## 2024-04-13 MED ORDER — SODIUM CHLORIDE 0.9 % IV SOLN
25.0000 mg | Freq: Four times a day (QID) | INTRAVENOUS | Status: DC | PRN
  Administered 2024-04-13: 25 mg via INTRAVENOUS
  Filled 2024-04-13: qty 25

## 2024-04-13 MED ORDER — ONDANSETRON 4 MG PO TBDP: 4.0000 mg | ORAL_TABLET | Freq: Once | ORAL | Status: DC

## 2024-04-13 MED ORDER — IOHEXOL 300 MG/ML  SOLN
100.0000 mL | Freq: Once | INTRAMUSCULAR | Status: AC | PRN
  Administered 2024-04-13: 100 mL via INTRAVENOUS

## 2024-04-13 MED ORDER — LACTATED RINGERS IV BOLUS
1000.0000 mL | Freq: Once | INTRAVENOUS | Status: AC
  Administered 2024-04-13: 1000 mL via INTRAVENOUS

## 2024-04-13 NOTE — ED Provider Triage Note (Signed)
 Emergency Medicine Provider Triage Evaluation Note  Alexis Petty , a 23 y.o. female  was evaluated in triage.  Pt complains of epigastric pain and 5-6 episodes of vomiting. H/o Chron's disease.  Patient has blood in her stool however this is chronic for her.  No diarrhea.  Reports he is having continuous nausea and vomiting since this morning.  Pain is in the epigastric region.  She reports that this feels different than her Crohn's pain.  No fever.  No recent sick contacts.  No urinary symptoms.  EMS showed HTN, pulse 114, CBG 98..  Review of Systems  Positive:  Negative:   Physical Exam  There were no vitals taken for this visit. Gen:  Uncomfortable Resp:  Normal effort  MSK:   Moves extremities without difficulty  Other:  Tender throughout the belly but mainly tender in the epigastric region.  Soft.  Medical Decision Making  Medically screening exam initiated at 7:46 PM.  Appropriate orders placed.  Alexis Petty was informed that the remainder of the evaluation will be completed by another provider, this initial triage assessment does not replace that evaluation, and the importance of remaining in the ED until their evaluation is complete.  CT and labs ordered.    Alexis Petty, [place] 04/13/24 1950

## 2024-04-13 NOTE — ED Triage Notes (Signed)
 Pt. Arrives via ems for epi gastric abd pain x1 day with vomiting. Hx of crohns. Also endorses blood in her stools which she states happens all the time. Denies diarrhea.

## 2024-04-14 LAB — URINALYSIS, ROUTINE W REFLEX MICROSCOPIC
Bacteria, UA: NONE SEEN
Bilirubin Urine: NEGATIVE
Glucose, UA: NEGATIVE mg/dL
Hgb urine dipstick: NEGATIVE
Ketones, ur: NEGATIVE mg/dL
Leukocytes,Ua: NEGATIVE
Nitrite: NEGATIVE
Protein, ur: NEGATIVE mg/dL
Specific Gravity, Urine: 1.01 (ref 1.005–1.030)
pH: 5 (ref 5.0–8.0)

## 2024-04-14 MED ORDER — PROMETHAZINE HCL 25 MG PO TABS: 25.0000 mg | ORAL_TABLET | Freq: Four times a day (QID) | ORAL | 0 refills | Status: DC | PRN

## 2024-04-14 NOTE — ED Notes (Signed)
 Discharge instructions reviewed with patient. Patient questions answered and opportunity for education reviewed. Patient voices understanding of discharge instructions with no further questions. Patient ambulatory with steady gait to lobby.

## 2024-04-14 NOTE — Discharge Instructions (Signed)
 You are seen in the emergency department today for concerns of abdominal pain.  Your labs and imaging were thankfully reassuring and no signs of any abnormality seen beyond some liquid stool in your bowel which could be consistent with a gastroenteritis type picture.  Given some of the blood that you have had in bowel movements, I would prefer to hold off on antibiotic therapy as this could worsen your condition.  Please follow-up with your GI team.  For any concerns or new or worsening symptoms, return to the emergency department.  I have sent a prescription for Phenergan  for nausea control.  Use this as prescribed.

## 2024-04-14 NOTE — ED Provider Notes (Signed)
 [place] EMERGENCY DEPARTMENT AT [facility] Provider Note   CSN: [id number] Arrival date & time: 04/13/24  8060     Patient presents with: Abdominal Pain   Alexis Petty is a 23 y.o. female.  Patient with past history significant for Crohn's disease, IBS mixed, presents to the emergency department concerns of abdominal pain.  And vomiting.  She reports that she began experience epigastric abdominal pain in the last day with several episodes of vomiting but no diarrhea.  Reports history of Crohn's and does endorse baseline bloody bowel movements every now and then.  States no significant change in the frequency of these bowel movements.  Denies any diarrhea at this time.  No sick contacts or any consumption of food that was possibly going bad.  No recent antibiotic use.  Does endorse recent steroid use for low back pain and finished his medication about 5 days ago or so.    Abdominal Pain      Prior to Admission medications   Medication Sig Start Date End Date Taking? Authorizing Provider  promethazine  (PHENERGAN ) 25 MG tablet Take 1 tablet (25 mg total) by mouth every 6 (six) hours as needed for nausea or vomiting. 04/14/24  Yes Wiktoria Hemrick A, PA-C  ALAYCEN 1/35 tablet Take 1 tablet by mouth daily. 08/19/20   [provider]  cyclobenzaprine (FLEXERIL) 10 MG tablet Take 10 mg by mouth 3 (three) times daily.    [provider]  gabapentin (NEURONTIN) 600 MG tablet Take 600 mg by mouth 3 (three) times daily.    [provider]  ketoconazole (NIZORAL) 2 % cream Apply topically. 06/14/22   [provider]  levothyroxine (SYNTHROID) 75 MCG tablet Take by mouth. 07/12/22   [provider]  ondansetron  (ZOFRAN  ODT) 8 MG disintegrating tablet Take 1 tablet (8 mg total) by mouth every 8 (eight) hours as needed for nausea or vomiting. 06/23/21   Dasie Faden, MD  pantoprazole (PROTONIX) 40 MG tablet Take 40 mg by mouth 2 (two) times daily.  08/24/20   [provider]  predniSONE  (STERAPRED UNI-PAK 21 TAB) 10 MG (21) TBPK tablet Take by mouth as directed. Patient not taking: Reported on 12/28/2023 10/10/23   [provider]  promethazine  (PHENERGAN ) 12.5 MG tablet Take 12.5 mg by mouth every 8 (eight) hours as needed for nausea or vomiting. 02/22/21   [provider]  promethazine  (PHENERGAN ) 25 MG suppository Place 1 suppository (25 mg total) rectally every 6 (six) hours as needed for nausea or vomiting. 12/28/23   Randol Simmonds, MD    Allergies: Windell punter, Ibuprofen , Reglan  Hayleigh.Haagensen ], Tylenol  [acetaminophen ], and Haldol  Eirene.Dustman ]    Review of Systems  Gastrointestinal:  Positive for abdominal pain.  All other systems reviewed and are negative.   Updated Vital Signs BP (!) 148/100   Pulse 99   Temp 98.2 F (36.8 C) (Oral)   Resp 16   SpO2 98%   Physical Exam Vitals and nursing note reviewed.  Constitutional:      General: She is not in acute distress.    Appearance: She is well-developed.  HENT:     Head: Normocephalic and atraumatic.  Eyes:     Conjunctiva/sclera: Conjunctivae normal.  Cardiovascular:     Rate and Rhythm: Normal rate and regular rhythm.     Heart sounds: No murmur heard. Pulmonary:     Effort: Pulmonary effort is normal. No respiratory distress.     Breath sounds: Normal breath sounds.  Abdominal:  Palpations: Abdomen is soft.     Tenderness: There is generalized abdominal tenderness and tenderness in the epigastric area. There is no guarding or rebound.  Musculoskeletal:        General: No swelling.     Cervical back: Neck supple.  Skin:    General: Skin is warm and dry.     Capillary Refill: Capillary refill takes less than 2 seconds.  Neurological:     Mental Status: She is alert.  Psychiatric:        Mood and Affect: Mood normal.     (all labs ordered are listed, but only abnormal results are displayed) Labs Reviewed  CBC WITH  DIFFERENTIAL/PLATELET - Abnormal; Notable for the following components:      Result Value   WBC 20.7 (*)    RBC 5.29 (*)    Hemoglobin 15.5 (*)    HCT 47.8 (*)    Neutro Abs 17.5 (*)    Abs Immature Granulocytes 0.09 (*)    All other components within normal limits  COMPREHENSIVE METABOLIC PANEL WITH GFR - Abnormal; Notable for the following components:   Sodium 134 (*)    CO2 20 (*)    Glucose, Bld 117 (*)    ALT 45 (*)    All other components within normal limits  URINALYSIS, ROUTINE W REFLEX MICROSCOPIC  HCG, SERUM, QUALITATIVE  LIPASE, BLOOD    EKG: None  Radiology: CT ABDOMEN PELVIS W CONTRAST Result Date: 04/13/2024 CLINICAL DATA:  Crohn's exacerbation with upper abdominal pain. Epigastric pain and vomiting. Blood in stools. EXAM: CT ABDOMEN AND PELVIS WITH CONTRAST TECHNIQUE: Multidetector CT imaging of the abdomen and pelvis was performed using the standard protocol following bolus administration of intravenous contrast. RADIATION DOSE REDUCTION: This exam was performed according to the departmental dose-optimization program which includes automated exposure control, adjustment of the mA and/or kV according to patient size and/or use of iterative reconstruction technique. CONTRAST:  OMNIPAQUE  IOHEXOL  300 MG/ML  SOLN COMPARISON:  12/28/2023 FINDINGS: Lower chest: Mild dependent changes in the left lung base. Hepatobiliary: Mild diffuse fatty infiltration of the liver. No focal lesions. Gallbladder is surgically absent. No bile duct dilatation. Pancreas: Unremarkable. No pancreatic ductal dilatation or surrounding inflammatory changes. Spleen: Normal in size without focal abnormality. Adrenals/Urinary Tract: No adrenal gland nodules. Kidneys are symmetrical. Nephrograms are homogeneous. 2 mm stone in the midpole left kidney. No hydronephrosis or hydroureter. Bladder is normal. Stomach/Bowel: Stomach, small bowel, and colon are not abnormally distended. Stool and air-fluid levels  in the colon suggesting liquid stool. No small or large bowel wall thickening or inflammatory stranding. Appendix is not identified. Vascular/Lymphatic: No significant vascular findings are present. No enlarged abdominal or pelvic lymph nodes. Reproductive: Uterus and bilateral adnexa are unremarkable. Other: No abdominal wall hernia or abnormality. No abdominopelvic ascites. Musculoskeletal: No acute or significant osseous findings. IMPRESSION: 1. No evidence of bowel obstruction or inflammation. Liquid stool seen in the colon. 2. Mild diffuse fatty infiltration of the liver. 3. Nonobstructing left intrarenal stone. Electronically Signed   By: Elsie Gravely M.D.   On: 04/13/2024 22:45     Procedures   Medications Ordered in the ED  promethazine  (PHENERGAN ) 25 mg in sodium chloride  0.9 % 50 mL IVPB (0 mg Intravenous Stopped 04/13/24 2215)  iohexol  (OMNIPAQUE ) 300 MG/ML solution 100 mL (100 mLs Intravenous Contrast Given 04/13/24 2212)  lactated ringers  bolus 1,000 mL (0 mLs Intravenous Stopped 04/14/24 0119)  Medical Decision Making Risk Prescription drug management.   This patient presents to the ED for concern of abdominal pain, this involves an extensive number of treatment options, and is a complaint that carries with it a high risk of complications and morbidity.  The differential diagnosis includes bowel obstruction, gastritis, abdominal abscess, Crohn's exacerbation   Co morbidities that complicate the patient evaluation  Crohn's disease, IBS   Lab Tests:  I Ordered, and personally interpreted labs.  The pertinent results include: CBC reveals leukocytosis at 20.7 the patient reports is somewhat chronic but slightly worse than typical, CMP with slight dehydration with sodium at 134 but no AKI, lipase unremarkable at 29, hCG negative   Imaging Studies ordered:  I ordered imaging studies including CT abdomen pelvis I independently visualized and  interpreted imaging which showed: No evidence of bowel obstruction or inflammation. Liquid stool seen in the colon.  Mild diffuse fatty infiltration of the liver.  Nonobstructing left intrarenal stone. I agree with the radiologist interpretation   Consultations Obtained:  I requested consultation with none,  and discussed lab and imaging findings as well as pertinent plan - they recommend: N/A   Problem List / ED Course / Critical interventions / Medication management  Patient with past history significant for Crohn's disease presents the emergency department concerns of abdominal pain, nausea, vomiting, diarrhea.  Reports the symptoms been ongoing for the last day or so with pain primarily present towards the epigastrium.  No prior history of similar symptoms and does not feel this is a Crohn's flare.  Does endorse some occasional blood in her stools that she states is baseline for her given her Crohn's disease.  Not currently on any therapy for management of her Crohn's.  She does endorse that she was recently on steroids for some back pain but no recent antibiotic use. Exam is reassuring with improving vitals after fluid bolus.  No significant abdominal tenderness seen.  Normal bowel sounds. Lab workup shows leukocytosis at 20.7 possibly reactive in nature given vomiting but also can be seen in the setting of recent steroid use that patient does endorse.  Regarding electrolytes, only mild hyponatremia 134 but no severe signs of dehydration.  UA unremarkable with no signs of infection.  hCG negative, lipase unremarkable at 29. Given clinical improvement after fluid bolus of tachycardia resolved, do suspect likely mild dehydration.  CT imaging negative without any significant findings seen.  Will discharge home with plans for outpatient follow-up.  Phenergan  prescribed for nausea control. I ordered medication including fluids, Phenergan  for dehydration, nausea Reevaluation of the patient after  these medicines showed that the patient improved I have reviewed the patients home medicines and have made adjustments as needed   Social Determinants of Health:  History of Crohn's disease   Test / Admission - Considered:  Considered but given improvement, discharged home.  Final diagnoses:  Gastroenteritis    ED Discharge Orders          Ordered    promethazine  (PHENERGAN ) 25 MG tablet  Every 6 hours PRN        04/14/24 0210               Kayshawn Ozburn A, PA-C 04/14/24 0214    Franklyn Sid SAILOR, MD 04/14/24 2348

## 2024-04-22 DIAGNOSIS — Z113 Encounter for screening for infections with a predominantly sexual mode of transmission: Secondary | ICD-10-CM | POA: Diagnosis not present

## 2024-04-22 DIAGNOSIS — Z3041 Encounter for surveillance of contraceptive pills: Secondary | ICD-10-CM | POA: Diagnosis not present

## 2024-04-22 DIAGNOSIS — R102 Pelvic and perineal pain: Secondary | ICD-10-CM | POA: Diagnosis not present

## 2024-04-22 DIAGNOSIS — N941 Unspecified dyspareunia: Secondary | ICD-10-CM | POA: Diagnosis not present

## 2024-04-22 DIAGNOSIS — Z7251 High risk heterosexual behavior: Secondary | ICD-10-CM | POA: Diagnosis not present

## 2024-04-22 DIAGNOSIS — G8929 Other chronic pain: Secondary | ICD-10-CM | POA: Diagnosis not present

## 2024-04-29 ENCOUNTER — Other Ambulatory Visit: Payer: Self-pay | Admitting: Nurse Practitioner

## 2024-04-29 DIAGNOSIS — D500 Iron deficiency anemia secondary to blood loss (chronic): Secondary | ICD-10-CM

## 2024-04-29 NOTE — Progress Notes (Signed)
 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  She has intolerance to iron  supplements. She receives IV iron  as needed with 200 mg IV of Venofer .  her most recent being on April 2024.  04/30/2024 -normal CBC with Hgb 14.5 and HCT 42.7.  Iron  is 168, TIBC 570, percent saturation is 30, transferrin 402, and ferritin is 36. -No requirement for IV iron  at this. -Plan to repeat labs and follow-up in 1 year, sooner if needed.       Plan Labs reviewed. - CBC unremarkable. - Iron  studies -iron  is 168; TIBC 570; percent saturation is 30; transferrin 402; and ferritin is 36. Recommend she continue POP2 regulate menstrual irregularities and lessen menstrual blood loss.  She is scheduled for laparoscopic surgery on 05/26/2024 to evaluate for endometriosis. Labs and follow-up in 1 year, sooner if needed.  The patient understands the plans discussed today and is in agreement with them.  She knows to contact our office if she develops concerns prior to her next appointment.  I provided 20 minutes of face-to-face time during this encounter and > 50% was spent counseling as documented under my assessment and plan.    Powell BRAVO Lessen, NP  [facility] - A DEPT OF [facility] [address] [place] [address] Dept: [phone] Dept Fax: [phone]   No orders of the defined types were placed in this encounter.     CHIEF COMPLAINT:  CC: Iron  deficiency anemia  Current Treatment: IV iron , Venofer  200 mg as needed.  Last dose on 01/25/2023.  INTERVAL HISTORY:  Alexis Petty is here today for repeat clinical assessment.  She last saw [place], [name], on  11/01/2023.  She does have history of chron's disease and heavy menstrual cycles.  She is now taking POP to help regulate her menstrual cycles and reduce menstrual blood flow.  She states this has helped significantly.  She is scheduled for laparoscopic surgery on 05/26/2024 to evaluate for endometriosis.  She feels fatigued.  Recently diagnosed with hypothyroid and she feels like contributes to her tiredness.  She denies chest pain, chest pressure, or shortness of breath. She denies headaches or visual disturbances. She denies abdominal pain, nausea, vomiting, or changes in bowel or bladder habits.  She denies fevers or chills. She denies pain. Her appetite is good. Her weight has been stable.  I have reviewed the past medical history, past surgical history, social history and family history with the patient and they are unchanged from previous note.  ALLERGIES:  is allergic to cocos nucifera, ibuprofen , reglan  [metoclopramide ], tylenol  [acetaminophen ], and haldol  [haloperidol ].  MEDICATIONS:  Current Outpatient Medications  Medication Sig Dispense Refill   ALAYCEN 1/35 tablet Take 1 tablet by mouth daily.     cyclobenzaprine (FLEXERIL) 10 MG tablet Take 10 mg by mouth 3 (three) times daily.     gabapentin (NEURONTIN) 600 MG tablet Take 600 mg by mouth 3 (three) times daily.     ketoconazole (NIZORAL) 2 % cream Apply topically.     levothyroxine (SYNTHROID) 75 MCG tablet Take by mouth.     ondansetron  (ZOFRAN  ODT) 8 MG disintegrating tablet Take 1 tablet (8 mg total) by mouth every  8 (eight) hours as needed for nausea or vomiting. 20 tablet 0   pantoprazole (PROTONIX) 40 MG tablet Take 40 mg by mouth 2 (two) times daily.     promethazine  (PHENERGAN ) 12.5 MG tablet Take 12.5 mg by mouth every 8 (eight) hours as needed for nausea or vomiting.     promethazine  (PHENERGAN ) 25 MG suppository Place 1 suppository (25 mg total) rectally every 6 (six) hours as needed for nausea or vomiting. 12 each 0    promethazine  (PHENERGAN ) 25 MG tablet Take 1 tablet (25 mg total) by mouth every 6 (six) hours as needed for nausea or vomiting. 20 tablet 0   predniSONE  (STERAPRED UNI-PAK 21 TAB) 10 MG (21) TBPK tablet Take by mouth as directed. (Patient not taking: Reported on 04/30/2024)     No current facility-administered medications for this visit.     REVIEW OF SYSTEMS:   Constitutional: Denies fevers, chills or abnormal weight loss. Fatigue.   Eyes: Denies blurriness of vision Ears, nose, mouth, throat, and face: Denies mucositis or sore throat Respiratory: Denies cough, dyspnea or wheezes Cardiovascular: Denies palpitation, chest discomfort or lower extremity swelling Gastrointestinal:  Denies nausea, heartburn or change in bowel habits Skin: Denies abnormal skin rashes Lymphatics: Denies new lymphadenopathy or easy bruising Neurological:Denies numbness, tingling or new weaknesses Behavioral/Psych: Mood is stable, no new changes  All other systems were reviewed with the patient and are negative.   VITALS:   Today's Vitals   04/30/24 0942 04/30/24 1000  BP: 130/78   Pulse: 87   Resp: 17   Temp: 98.4 F (36.9 C)   SpO2: 97%   Weight: 237 lb 6.4 oz (107.7 kg)   PainSc:  0-No pain   Body mass index is 42.05 kg/m.   Wt Readings from Last 3 Encounters:  04/30/24 237 lb 6.4 oz (107.7 kg)  02/08/23 239 lb (108.4 kg)  01/01/23 246 lb 3.2 oz (111.7 kg)    Body mass index is 42.05 kg/m.  Performance status (ECOG): 1 - Symptomatic but completely ambulatory  PHYSICAL EXAM:   GENERAL:alert, no distress and comfortable SKIN: skin color, texture, turgor are normal, no rashes or significant lesions EYES: normal, Conjunctiva are pink and non-injected, sclera clear OROPHARYNX:no exudate, no erythema and lips, buccal mucosa, and tongue normal  NECK: supple, thyroid  normal size, non-tender, without nodularity LYMPH:  no palpable lymphadenopathy in the cervical, axillary or inguinal LUNGS:  clear to auscultation and percussion with normal breathing effort HEART: regular rate & rhythm and no murmurs and no lower extremity edema ABDOMEN:abdomen soft, non-tender and normal bowel sounds Musculoskeletal:no cyanosis of digits and no clubbing  NEURO: alert & oriented x 3 with fluent speech, no focal motor/sensory deficits  LABORATORY DATA:  I have reviewed the data as listed    Component Value Date/Time   NA 134 (L) 04/13/2024 2000   K 3.8 04/13/2024 2000   CL 103 04/13/2024 2000   CO2 20 (L) 04/13/2024 2000   GLUCOSE 117 (H) 04/13/2024 2000   BUN 9 04/13/2024 2000   CREATININE 0.81 04/13/2024 2000   CREATININE 0.79 06/28/2022 1006   CALCIUM 9.1 04/13/2024 2000   PROT 7.6 04/13/2024 2000   ALBUMIN 3.8 04/13/2024 2000   AST 41 04/13/2024 2000   ALT 45 (H) 04/13/2024 2000   ALKPHOS 62 04/13/2024 2000   BILITOT 0.6 04/13/2024 2000   GFRNONAA >60 04/13/2024 2000   GFRAA >60 08/31/2019 1126     Lab Results  Component Value Date   WBC 7.4  04/30/2024   NEUTROABS 3.5 04/30/2024   HGB 14.5 04/30/2024   HCT 42.7 04/30/2024   MCV 86.4 04/30/2024   PLT 345 04/30/2024     RADIOGRAPHIC STUDIES: CT ABDOMEN PELVIS W CONTRAST Result Date: 04/13/2024 CLINICAL DATA:  Crohn's exacerbation with upper abdominal pain. Epigastric pain and vomiting. Blood in stools. EXAM: CT ABDOMEN AND PELVIS WITH CONTRAST TECHNIQUE: Multidetector CT imaging of the abdomen and pelvis was performed using the standard protocol following bolus administration of intravenous contrast. RADIATION DOSE REDUCTION: This exam was performed according to the departmental dose-optimization program which includes automated exposure control, adjustment of the mA and/or kV according to patient size and/or use of iterative reconstruction technique. CONTRAST:  OMNIPAQUE  IOHEXOL  300 MG/ML  SOLN COMPARISON:  12/28/2023 FINDINGS: Lower chest: Mild dependent changes in the left lung base. Hepatobiliary: Mild diffuse fatty  infiltration of the liver. No focal lesions. Gallbladder is surgically absent. No bile duct dilatation. Pancreas: Unremarkable. No pancreatic ductal dilatation or surrounding inflammatory changes. Spleen: Normal in size without focal abnormality. Adrenals/Urinary Tract: No adrenal gland nodules. Kidneys are symmetrical. Nephrograms are homogeneous. 2 mm stone in the midpole left kidney. No hydronephrosis or hydroureter. Bladder is normal. Stomach/Bowel: Stomach, small bowel, and colon are not abnormally distended. Stool and air-fluid levels in the colon suggesting liquid stool. No small or large bowel wall thickening or inflammatory stranding. Appendix is not identified. Vascular/Lymphatic: No significant vascular findings are present. No enlarged abdominal or pelvic lymph nodes. Reproductive: Uterus and bilateral adnexa are unremarkable. Other: No abdominal wall hernia or abnormality. No abdominopelvic ascites. Musculoskeletal: No acute or significant osseous findings. IMPRESSION: 1. No evidence of bowel obstruction or inflammation. Liquid stool seen in the colon. 2. Mild diffuse fatty infiltration of the liver. 3. Nonobstructing left intrarenal stone. Electronically Signed   By: Elsie Gravely M.D.   On: 04/13/2024 22:45

## 2024-04-29 NOTE — Assessment & Plan Note (Addendum)
 iron  deficiency anemia secondary to chronic menstrual blood loss and possible GI blood loss from Crohn's disease.  She has intolerance to iron  supplements. She receives IV iron  as needed with 200 mg IV of Venofer .  her most recent being on April 2024.  04/30/2024 -normal CBC with Hgb 14.5 and HCT 42.7.  Iron  is 168, TIBC 570, percent saturation is 30, transferrin 402, and ferritin is 36. -No requirement for IV iron  at this. -Plan to repeat labs and follow-up in 1 year, sooner if needed.

## 2024-04-30 ENCOUNTER — Inpatient Hospital Stay: Payer: Medicare HMO | Attending: Nurse Practitioner

## 2024-04-30 ENCOUNTER — Inpatient Hospital Stay (HOSPITAL_BASED_OUTPATIENT_CLINIC_OR_DEPARTMENT_OTHER): Payer: Medicare HMO | Admitting: Nurse Practitioner

## 2024-04-30 VITALS — BP 130/78 | HR 87 | Temp 98.4°F | Resp 17 | Wt 237.4 lb

## 2024-04-30 DIAGNOSIS — D500 Iron deficiency anemia secondary to blood loss (chronic): Secondary | ICD-10-CM

## 2024-04-30 DIAGNOSIS — K50911 Crohn's disease, unspecified, with rectal bleeding: Secondary | ICD-10-CM

## 2024-04-30 DIAGNOSIS — D509 Iron deficiency anemia, unspecified: Secondary | ICD-10-CM | POA: Diagnosis not present

## 2024-04-30 DIAGNOSIS — Z79899 Other long term (current) drug therapy: Secondary | ICD-10-CM

## 2024-04-30 LAB — CBC WITH DIFFERENTIAL (CANCER CENTER ONLY)
Abs Immature Granulocytes: 0.02 K/uL (ref 0.00–0.07)
Basophils Absolute: 0.1 K/uL (ref 0.0–0.1)
Basophils Relative: 1 %
Eosinophils Absolute: 0.2 K/uL (ref 0.0–0.5)
Eosinophils Relative: 3 %
HCT: 42.7 % (ref 36.0–46.0)
Hemoglobin: 14.5 g/dL (ref 12.0–15.0)
Immature Granulocytes: 0 %
Lymphocytes Relative: 44 %
Lymphs Abs: 3.2 K/uL (ref 0.7–4.0)
MCH: 29.4 pg (ref 26.0–34.0)
MCHC: 34 g/dL (ref 30.0–36.0)
MCV: 86.4 fL (ref 80.0–100.0)
Monocytes Absolute: 0.4 K/uL (ref 0.1–1.0)
Monocytes Relative: 6 %
Neutro Abs: 3.5 K/uL (ref 1.7–7.7)
Neutrophils Relative %: 46 %
Platelet Count: 345 K/uL (ref 150–400)
RBC: 4.94 MIL/uL (ref 3.87–5.11)
RDW: 12.7 % (ref 11.5–15.5)
WBC Count: 7.4 K/uL (ref 4.0–10.5)
nRBC: 0 % (ref 0.0–0.2)

## 2024-04-30 LAB — FERRITIN: Ferritin: 36 ng/mL (ref 11–307)

## 2024-04-30 LAB — IRON AND IRON BINDING CAPACITY (CC-WL,HP ONLY)
Iron: 168 ug/dL (ref 28–170)
Saturation Ratios: 30 % (ref 10.4–31.8)
TIBC: 570 ug/dL — ABNORMAL HIGH (ref 250–450)
UIBC: 402 ug/dL (ref 148–442)

## 2024-05-03 ENCOUNTER — Encounter: Payer: Self-pay | Admitting: Physician Assistant

## 2024-05-03 ENCOUNTER — Encounter: Payer: Self-pay | Admitting: Nurse Practitioner

## 2024-05-21 DIAGNOSIS — Z01812 Encounter for preprocedural laboratory examination: Secondary | ICD-10-CM | POA: Diagnosis not present

## 2024-05-21 DIAGNOSIS — Z01818 Encounter for other preprocedural examination: Secondary | ICD-10-CM | POA: Diagnosis not present

## 2024-05-26 DIAGNOSIS — D539 Nutritional anemia, unspecified: Secondary | ICD-10-CM | POA: Diagnosis not present

## 2024-05-26 DIAGNOSIS — R112 Nausea with vomiting, unspecified: Secondary | ICD-10-CM | POA: Diagnosis not present

## 2024-05-26 DIAGNOSIS — J45909 Unspecified asthma, uncomplicated: Secondary | ICD-10-CM | POA: Diagnosis not present

## 2024-05-26 DIAGNOSIS — N8042 Endometriosis of rectovaginal septum with involvement of vagina: Secondary | ICD-10-CM | POA: Diagnosis not present

## 2024-05-26 DIAGNOSIS — K219 Gastro-esophageal reflux disease without esophagitis: Secondary | ICD-10-CM | POA: Diagnosis not present

## 2024-05-26 DIAGNOSIS — N80102 Endometriosis of left ovary, unspecified depth: Secondary | ICD-10-CM | POA: Diagnosis not present

## 2024-05-26 DIAGNOSIS — N736 Female pelvic peritoneal adhesions (postinfective): Secondary | ICD-10-CM | POA: Diagnosis not present

## 2024-05-26 DIAGNOSIS — M7989 Other specified soft tissue disorders: Secondary | ICD-10-CM | POA: Diagnosis not present

## 2024-05-26 DIAGNOSIS — D219 Benign neoplasm of connective and other soft tissue, unspecified: Secondary | ICD-10-CM | POA: Diagnosis not present

## 2024-05-26 DIAGNOSIS — N803C2 Endometriosis of the left uterosacral ligament, unspecified depth: Secondary | ICD-10-CM | POA: Diagnosis not present

## 2024-05-26 DIAGNOSIS — Z6841 Body Mass Index (BMI) 40.0 and over, adult: Secondary | ICD-10-CM | POA: Diagnosis not present

## 2024-05-26 DIAGNOSIS — R102 Pelvic and perineal pain: Secondary | ICD-10-CM | POA: Diagnosis not present

## 2024-05-26 DIAGNOSIS — G8929 Other chronic pain: Secondary | ICD-10-CM | POA: Diagnosis not present

## 2024-05-26 DIAGNOSIS — E039 Hypothyroidism, unspecified: Secondary | ICD-10-CM | POA: Diagnosis not present

## 2024-06-01 DIAGNOSIS — F1120 Opioid dependence, uncomplicated: Secondary | ICD-10-CM | POA: Diagnosis not present

## 2024-06-22 DIAGNOSIS — Z4889 Encounter for other specified surgical aftercare: Secondary | ICD-10-CM | POA: Diagnosis not present

## 2024-06-22 DIAGNOSIS — Z9889 Other specified postprocedural states: Secondary | ICD-10-CM | POA: Diagnosis not present

## 2024-06-22 DIAGNOSIS — N809 Endometriosis, unspecified: Secondary | ICD-10-CM | POA: Diagnosis not present

## 2024-06-22 DIAGNOSIS — Z30012 Encounter for prescription of emergency contraception: Secondary | ICD-10-CM | POA: Diagnosis not present

## 2024-06-22 DIAGNOSIS — G8929 Other chronic pain: Secondary | ICD-10-CM | POA: Diagnosis not present

## 2024-06-22 DIAGNOSIS — R102 Pelvic and perineal pain: Secondary | ICD-10-CM | POA: Diagnosis not present

## 2024-09-08 ENCOUNTER — Encounter (HOSPITAL_COMMUNITY): Payer: Self-pay

## 2024-09-08 ENCOUNTER — Emergency Department (HOSPITAL_COMMUNITY)
Admission: EM | Admit: 2024-09-08 | Discharge: 2024-09-08 | Disposition: A | Discharge status: Home / Self Care | Attending: Emergency Medicine | Admitting: Emergency Medicine

## 2024-09-08 ENCOUNTER — Other Ambulatory Visit: Payer: Self-pay

## 2024-09-08 DIAGNOSIS — D72829 Elevated white blood cell count, unspecified: Secondary | ICD-10-CM | POA: Diagnosis not present

## 2024-09-08 DIAGNOSIS — R1111 Vomiting without nausea: Secondary | ICD-10-CM | POA: Diagnosis not present

## 2024-09-08 DIAGNOSIS — R112 Nausea with vomiting, unspecified: Secondary | ICD-10-CM | POA: Insufficient documentation

## 2024-09-08 DIAGNOSIS — R197 Diarrhea, unspecified: Secondary | ICD-10-CM | POA: Diagnosis not present

## 2024-09-08 DIAGNOSIS — R1033 Periumbilical pain: Secondary | ICD-10-CM | POA: Diagnosis not present

## 2024-09-08 DIAGNOSIS — R457 State of emotional shock and stress, unspecified: Secondary | ICD-10-CM | POA: Diagnosis not present

## 2024-09-08 DIAGNOSIS — R110 Nausea: Secondary | ICD-10-CM | POA: Diagnosis not present

## 2024-09-08 DIAGNOSIS — I10 Essential (primary) hypertension: Secondary | ICD-10-CM | POA: Diagnosis not present

## 2024-09-08 LAB — CBC WITH DIFFERENTIAL/PLATELET
Abs Immature Granulocytes: 0.07 K/uL (ref 0.00–0.07)
Basophils Absolute: 0.1 K/uL (ref 0.0–0.1)
Basophils Relative: 0 %
Eosinophils Absolute: 0.1 K/uL (ref 0.0–0.5)
Eosinophils Relative: 1 %
HCT: 46.2 % — ABNORMAL HIGH (ref 36.0–46.0)
Hemoglobin: 14.8 g/dL (ref 12.0–15.0)
Immature Granulocytes: 0 %
Lymphocytes Relative: 9 %
Lymphs Abs: 1.5 K/uL (ref 0.7–4.0)
MCH: 28.2 pg (ref 26.0–34.0)
MCHC: 32 g/dL (ref 30.0–36.0)
MCV: 88 fL (ref 80.0–100.0)
Monocytes Absolute: 1 K/uL (ref 0.1–1.0)
Monocytes Relative: 5 %
Neutro Abs: 15.5 K/uL — ABNORMAL HIGH (ref 1.7–7.7)
Neutrophils Relative %: 85 %
Platelets: 310 K/uL (ref 150–400)
RBC: 5.25 MIL/uL — ABNORMAL HIGH (ref 3.87–5.11)
RDW: 13.2 % (ref 11.5–15.5)
WBC: 18.2 K/uL — ABNORMAL HIGH (ref 4.0–10.5)
nRBC: 0 % (ref 0.0–0.2)

## 2024-09-08 LAB — URINALYSIS, ROUTINE W REFLEX MICROSCOPIC
Bilirubin Urine: NEGATIVE
Glucose, UA: NEGATIVE mg/dL
Ketones, ur: NEGATIVE mg/dL
Nitrite: NEGATIVE
Protein, ur: 30 mg/dL — AB
Specific Gravity, Urine: 1.028 (ref 1.005–1.030)
pH: 6 (ref 5.0–8.0)

## 2024-09-08 LAB — COMPREHENSIVE METABOLIC PANEL WITH GFR
ALT: 21 U/L (ref 0–44)
AST: 23 U/L (ref 15–41)
Albumin: 3.7 g/dL (ref 3.5–5.0)
Alkaline Phosphatase: 73 U/L (ref 38–126)
Anion gap: 10 (ref 5–15)
BUN: 8 mg/dL (ref 6–20)
CO2: 20 mmol/L — ABNORMAL LOW (ref 22–32)
Calcium: 8.2 mg/dL — ABNORMAL LOW (ref 8.9–10.3)
Chloride: 108 mmol/L (ref 98–111)
Creatinine, Ser: 0.74 mg/dL (ref 0.44–1.00)
GFR, Estimated: >60 mL/min (ref >60)
Glucose, Bld: 93 mg/dL (ref 70–99)
Potassium: 4.2 mmol/L (ref 3.5–5.1)
Sodium: 138 mmol/L (ref 135–145)
Total Bilirubin: 0.3 mg/dL (ref 0.0–1.2)
Total Protein: 6.4 g/dL — ABNORMAL LOW (ref 6.5–8.1)

## 2024-09-08 LAB — I-STAT CHEM 8, ED
BUN: 7 mg/dL (ref 6–20)
Calcium, Ion: 1.07 mmol/L — ABNORMAL LOW (ref 1.15–1.40)
Chloride: 108 mmol/L (ref 98–111)
Creatinine, Ser: 0.8 mg/dL (ref 0.44–1.00)
Glucose, Bld: 90 mg/dL (ref 70–99)
HCT: 42 % (ref 36.0–46.0)
Hemoglobin: 14.3 g/dL (ref 12.0–15.0)
Potassium: 4.1 mmol/L (ref 3.5–5.1)
Sodium: 140 mmol/L (ref 135–145)
TCO2: 20 mmol/L — ABNORMAL LOW (ref 22–32)

## 2024-09-08 LAB — HCG, SERUM, QUALITATIVE: Preg, Serum: NEGATIVE

## 2024-09-08 LAB — LIPASE, BLOOD: Lipase: 20 U/L (ref 11–51)

## 2024-09-08 MED ORDER — SODIUM CHLORIDE 0.9 % IV BOLUS
1000.0000 mL | Freq: Once | INTRAVENOUS | Status: AC
  Administered 2024-09-08: 1000 mL via INTRAVENOUS

## 2024-09-08 MED ORDER — PROMETHAZINE HCL 25 MG PO TABS: 25.0000 mg | ORAL_TABLET | Freq: Four times a day (QID) | ORAL | 0 refills | Status: AC | PRN

## 2024-09-08 MED ORDER — DICYCLOMINE HCL 20 MG PO TABS: 20.0000 mg | ORAL_TABLET | Freq: Two times a day (BID) | ORAL | 0 refills | Status: AC

## 2024-09-08 MED ORDER — PROMETHAZINE HCL 25 MG RE SUPP: 25.0000 mg | Freq: Four times a day (QID) | RECTAL | 0 refills | Status: AC | PRN

## 2024-09-08 MED ORDER — SODIUM CHLORIDE 0.9 % IV SOLN
12.5000 mg | Freq: Four times a day (QID) | INTRAVENOUS | Status: DC | PRN
  Administered 2024-09-08: 12.5 mg via INTRAVENOUS
  Filled 2024-09-08: qty 0.5

## 2024-09-08 NOTE — Discharge Instructions (Signed)

## 2024-09-08 NOTE — ED Triage Notes (Signed)
 PER EMS: pt is from home with c/o abdominal pain, nausea, vomiting and diarrhea x 2 days. EMS adm 100mcg of Fentanyl  and 4mg  of Zofran  en route.   Hx of Crohns and has not been ab;le to take her normal medications due to switching doctors.  BP- 200/100, HR-100, 98% RA

## 2024-09-08 NOTE — ED Provider Notes (Signed)
 [place] EMERGENCY DEPARTMENT AT [facility] Provider Note   CSN: [id number] Arrival date & time: 09/08/24  1558     Patient presents with: Abdominal Pain   Alexis Petty is a 23 y.o. female.  {Add pertinent medical, surgical, social history, OB history to HPI:32947}  Abdominal Pain      Prior to Admission medications   Medication Sig Start Date End Date Taking? Authorizing Provider  ALAYCEN 1/35 tablet Take 1 tablet by mouth daily. 08/19/20   [provider]  cyclobenzaprine (FLEXERIL) 10 MG tablet Take 10 mg by mouth 3 (three) times daily.    [provider]  gabapentin (NEURONTIN) 600 MG tablet Take 600 mg by mouth 3 (three) times daily.    [provider]  ketoconazole (NIZORAL) 2 % cream Apply topically. 06/14/22   [provider]  levothyroxine (SYNTHROID) 75 MCG tablet Take by mouth. 07/12/22   [provider]  ondansetron  (ZOFRAN  ODT) 8 MG disintegrating tablet Take 1 tablet (8 mg total) by mouth every 8 (eight) hours as needed for nausea or vomiting. 06/23/21   Dasie Faden, MD  pantoprazole (PROTONIX) 40 MG tablet Take 40 mg by mouth 2 (two) times daily. 08/24/20   [provider]  predniSONE  (STERAPRED UNI-PAK 21 TAB) 10 MG (21) TBPK tablet Take by mouth as directed. Patient not taking: Reported on 04/30/2024 10/10/23   [provider]  promethazine  (PHENERGAN ) 12.5 MG tablet Take 12.5 mg by mouth every 8 (eight) hours as needed for nausea or vomiting. 02/22/21   [provider]  promethazine  (PHENERGAN ) 25 MG suppository Place 1 suppository (25 mg total) rectally every 6 (six) hours as needed for nausea or vomiting. 12/28/23   Randol Simmonds, MD  promethazine  (PHENERGAN ) 25 MG tablet Take 1 tablet (25 mg total) by mouth every 6 (six) hours as needed for nausea or vomiting. 04/14/24   Zelaya, Oscar A, PA-C    Allergies: Windell punter, Ibuprofen , Reglan  [metoclopramide ], Tylenol  [acetaminophen ],  and Haldol  [haloperidol ]    Review of Systems  Gastrointestinal:  Positive for abdominal pain.    Updated Vital Signs BP (!) 167/108 (BP Location: Left Arm)   Pulse 95   Temp 98.2 F (36.8 C) (Oral)   Resp 20   Ht 5' 3 (1.6 m)   Wt 107.5 kg   LMP 07/21/2024   SpO2 100%   BMI 41.98 kg/m   Physical Exam  (all labs ordered are listed, but only abnormal results are displayed) Labs Reviewed  CBC WITH DIFFERENTIAL/PLATELET - Abnormal; Notable for the following components:      Result Value   WBC 18.2 (*)    RBC 5.25 (*)    HCT 46.2 (*)    Neutro Abs 15.5 (*)    All other components within normal limits  URINALYSIS, ROUTINE W REFLEX MICROSCOPIC  HCG, SERUM, QUALITATIVE  COMPREHENSIVE METABOLIC PANEL WITH GFR  LIPASE, BLOOD    EKG: None  Radiology: No results found.  {Document cardiac monitor, telemetry assessment procedure when appropriate:32947} Procedures   Medications Ordered in the ED  sodium chloride  0.9 % bolus 1,000 mL (has no administration in time range)  promethazine  (PHENERGAN ) 12.5 mg in sodium chloride  0.9 % 50 mL IVPB (has no administration in time range)      {Click here for ABCD2, HEART and other calculators REFRESH Note before signing:1}  Medical Decision Making Amount and/or Complexity of Data Reviewed Labs: ordered.   ***  {Document critical care time when appropriate  Document review of labs and clinical decision tools ie CHADS2VASC2, etc  Document your independent review of radiology images and any outside records  Document your discussion with family members, caretakers and with consultants  Document social determinants of health affecting pt's care  Document your decision making why or why not admission, treatments were needed:32947:::1}   Final diagnoses:  None    ED Discharge Orders     None

## 2024-09-09 LAB — I-STAT CG4 LACTIC ACID, ED: Lactic Acid, Venous: 1.7 mmol/L (ref 0.5–1.9)

## 2024-10-20 ENCOUNTER — Ambulatory Visit: Payer: Self-pay

## 2024-10-20 NOTE — Telephone Encounter (Signed)
 FYI Only or Action Required?: Action required by provider: request for appointment.  Patient was last seen in primary care on N/A.  Called Nurse Triage reporting Appointment.    Triage Disposition: Information or Advice Only Call (overriding Call PCP When Office is Open)  Patient/caregiver understands and will follow disposition?: Yes        Answer Assessment - Initial Assessment Questions 1. REASON FOR CALL or QUESTION: What is your reason for calling today? or How can I best     Her mother is a patient of Dr Lebron and she would like to establish with him as a new patient. [facility] states send CRM to clinic for family members of established patients.  2. CALLER: Document the source of call. (e.g., laboratory staff, caregiver or patient).     Patient  Protocols used: PCP Call - No Triage-A-AH

## 2024-10-21 NOTE — Telephone Encounter (Signed)
 Patient is requesting a new patient appointment

## 2024-10-21 NOTE — Telephone Encounter (Signed)
 Negative.  Not taking new patients at present time.

## 2025-04-01 ENCOUNTER — Ambulatory Visit: Admitting: Family Medicine

## 2025-05-06 ENCOUNTER — Ambulatory Visit: Admitting: Physician Assistant

## 2025-05-06 ENCOUNTER — Other Ambulatory Visit
